# Patient Record
Sex: Female | Born: 1965 | Race: White | Hispanic: No | Smoking: Never smoker
Health system: Southern US, Community
[De-identification: ages and names within clinical notes are randomized; demographics above are authoritative.]

## PROBLEM LIST (undated history)

## (undated) DIAGNOSIS — Z9889 Other specified postprocedural states: Secondary | ICD-10-CM

## (undated) DIAGNOSIS — I10 Essential (primary) hypertension: Secondary | ICD-10-CM

## (undated) DIAGNOSIS — I428 Other cardiomyopathies: Secondary | ICD-10-CM

## (undated) HISTORY — DX: Other cardiomyopathies: I42.8

## (undated) HISTORY — PX: GASTRIC BYPASS: SHX52

## (undated) HISTORY — DX: Other specified postprocedural states: Z98.890

## (undated) HISTORY — PX: OTHER SURGICAL HISTORY: SHX169

## (undated) HISTORY — PX: BREAST SURGERY: SHX581

## (undated) HISTORY — DX: Essential (primary) hypertension: I10

---

## 2013-09-06 ENCOUNTER — Emergency Department (HOSPITAL_COMMUNITY): Payer: Self-pay

## 2013-09-06 ENCOUNTER — Encounter (HOSPITAL_COMMUNITY): Payer: Self-pay | Admitting: Emergency Medicine

## 2013-09-06 ENCOUNTER — Emergency Department (HOSPITAL_COMMUNITY)
Admission: EM | Admit: 2013-09-06 | Discharge: 2013-09-06 | Disposition: A | Discharge status: Home / Self Care | Payer: Self-pay | Attending: Emergency Medicine | Admitting: Emergency Medicine

## 2013-09-06 DIAGNOSIS — I509 Heart failure, unspecified: Secondary | ICD-10-CM | POA: Insufficient documentation

## 2013-09-06 DIAGNOSIS — J069 Acute upper respiratory infection, unspecified: Secondary | ICD-10-CM | POA: Insufficient documentation

## 2013-09-06 DIAGNOSIS — I158 Other secondary hypertension: Secondary | ICD-10-CM | POA: Insufficient documentation

## 2013-09-06 DIAGNOSIS — I159 Secondary hypertension, unspecified: Secondary | ICD-10-CM

## 2013-09-06 DIAGNOSIS — R0602 Shortness of breath: Secondary | ICD-10-CM | POA: Insufficient documentation

## 2013-09-06 DIAGNOSIS — Z79899 Other long term (current) drug therapy: Secondary | ICD-10-CM | POA: Insufficient documentation

## 2013-09-06 LAB — I-STAT TROPONIN, ED: TROPONIN I, POC: 0.02 ng/mL (ref 0.00–0.08)

## 2013-09-06 LAB — BASIC METABOLIC PANEL
ANION GAP: 16 — AB (ref 5–15)
BUN: 12 mg/dL (ref 6–23)
CHLORIDE: 101 meq/L (ref 96–112)
CO2: 21 meq/L (ref 19–32)
Calcium: 9.8 mg/dL (ref 8.4–10.5)
Creatinine, Ser: 0.7 mg/dL (ref 0.50–1.10)
GFR calc Af Amer: >90 mL/min (ref >90)
GFR calc non Af Amer: >90 mL/min (ref >90)
Glucose, Bld: 106 mg/dL — ABNORMAL HIGH (ref 70–99)
Potassium: 4.2 mEq/L (ref 3.7–5.3)
SODIUM: 138 meq/L (ref 137–147)

## 2013-09-06 LAB — CBC
HCT: 47.6 % — ABNORMAL HIGH (ref 36.0–46.0)
Hemoglobin: 15.9 g/dL — ABNORMAL HIGH (ref 12.0–15.0)
MCH: 31.7 pg (ref 26.0–34.0)
MCHC: 33.4 g/dL (ref 30.0–36.0)
MCV: 95 fL (ref 78.0–100.0)
Platelets: 228 K/uL (ref 150–400)
RBC: 5.01 MIL/uL (ref 3.87–5.11)
RDW: 13.9 % (ref 11.5–15.5)
WBC: 7.3 K/uL (ref 4.0–10.5)

## 2013-09-06 LAB — PRO B NATRIURETIC PEPTIDE: PRO B NATRI PEPTIDE: 1343 pg/mL — AB (ref 0–125)

## 2013-09-06 MED ORDER — FUROSEMIDE 20 MG PO TABS: 20.0000 mg | ORAL_TABLET | Freq: Every day | ORAL | Status: DC

## 2013-09-06 MED ORDER — LISINOPRIL 5 MG PO TABS: 5.0000 mg | ORAL_TABLET | Freq: Every day | ORAL | Status: DC

## 2013-09-06 MED ORDER — FUROSEMIDE 10 MG/ML IJ SOLN: 40.0000 mg | Freq: Once | INTRAMUSCULAR | Status: DC

## 2013-09-06 MED ORDER — ASPIRIN 325 MG PO TABS
325.0000 mg | ORAL_TABLET | Freq: Once | ORAL | Status: AC
  Administered 2013-09-06: 325 mg via ORAL
  Filled 2013-09-06: qty 1

## 2013-09-06 MED ORDER — ALBUTEROL SULFATE HFA 108 (90 BASE) MCG/ACT IN AERS
4.0000 | INHALATION_SPRAY | Freq: Once | RESPIRATORY_TRACT | Status: AC
  Administered 2013-09-06: 4 via RESPIRATORY_TRACT
  Filled 2013-09-06: qty 6.7

## 2013-09-06 MED ORDER — FUROSEMIDE 40 MG PO TABS
40.0000 mg | ORAL_TABLET | Freq: Once | ORAL | Status: AC
Start: 2013-09-06 — End: 2013-09-06
  Administered 2013-09-06: 40 mg via ORAL
  Filled 2013-09-06: qty 1

## 2013-09-06 MED ORDER — HYDROCHLOROTHIAZIDE 25 MG PO TABS: 12.5000 mg | ORAL_TABLET | Freq: Every day | ORAL | Status: DC

## 2013-09-06 NOTE — Discharge Instructions (Signed)
Heart Failure °Heart failure means your heart has trouble pumping blood. This makes it hard for your body to work well. Heart failure is usually a long-term (chronic) condition. You must take good care of yourself and follow your doctor's treatment plan. °HOME CARE °· Take your heart medicine as told by your doctor. °¨ Do not stop taking medicine unless your doctor tells you to. °¨ Do not skip any dose of medicine. °¨ Refill your medicines before they run out. °¨ Take other medicines only as told by your doctor or pharmacist. °· Stay active if told by your doctor. The elderly and people with severe heart failure should talk with a doctor about physical activity. °· Eat heart-healthy foods. Choose foods that are without trans fat and are low in saturated fat, cholesterol, and salt (sodium). This includes fresh or frozen fruits and vegetables, fish, lean meats, fat-free or low-fat dairy foods, whole grains, and high-fiber foods. Lentils and dried peas and beans (legumes) are also good choices. °· Limit salt if told by your doctor. °· Cook in a healthy way. Roast, grill, broil, bake, poach, steam, or stir-fry foods. °· Limit fluids as told by your doctor. °· Weigh yourself every morning. Do this after you pee (urinate) and before you eat breakfast. Write down your weight to give to your doctor. °· Take your blood pressure and write it down if your doctor tells you to. °· Ask your doctor how to check your pulse. Check your pulse as told. °· Lose weight if told by your doctor. °· Stop smoking or chewing tobacco. Do not use gum or patches that help you quit without your doctor's approval. °· Schedule and go to doctor visits as told. °· Nonpregnant women should have no more than 1 drink a day. Men should have no more than 2 drinks a day. Talk to your doctor about drinking alcohol. °· Stop illegal drug use. °· Stay current with shots (immunizations). °· Manage your health conditions as told by your doctor. °· Learn to  manage your stress. °· Rest when you are tired. °· If it is really hot outside: °¨ Avoid intense activities. °¨ Use air conditioning or fans, or get in a cooler place. °¨ Avoid caffeine and alcohol. °¨ Wear loose-fitting, lightweight, and light-colored clothing. °· If it is really cold outside: °¨ Avoid intense activities. °¨ Layer your clothing. °¨ Wear mittens or gloves, a hat, and a scarf when going outside. °¨ Avoid alcohol. °· Learn about heart failure and get support as needed. °· Get help to maintain or improve your quality of life and your ability to care for yourself as needed. °GET HELP IF:  °· You gain 03 lb/1.4 kg or more in 1 day or 05 lb/2.3 kg in a week. °· You are more short of breath than usual. °· You cannot do your normal activities. °· You tire easily. °· You cough more than normal, especially with activity. °· You have any or more puffiness (swelling) in areas such as your hands, feet, ankles, or belly (abdomen). °· You cannot sleep because it is hard to breathe. °· You feel like your heart is beating fast (palpitations). °· You get dizzy or light-headed when you stand up. °GET HELP RIGHT AWAY IF:  °· You have trouble breathing. °· There is a change in mental status, such as becoming less alert or not being able to focus. °· You have chest pain or discomfort. °· You faint. °MAKE SURE YOU:  °· Understand these instructions. °·   Will watch your condition.  Will get help right away if you are not doing well or get worse. Document Released: 09/28/2007 Document Revised: 05/05/2013 Document Reviewed: 02/05/2012 [facility] Patient Information 2015 [place], [place]. This information is not intended to replace advice given to you by your health care provider. Make sure you discuss any questions you have with your health care provider.  Hypertension Hypertension, commonly called high blood pressure, is when the force of blood pumping through your arteries is too strong. Your arteries are the blood  vessels that carry blood from your heart throughout your body. A blood pressure reading consists of a higher number over a lower number, such as 110/72. The higher number (systolic) is the pressure inside your arteries when your heart pumps. The lower number (diastolic) is the pressure inside your arteries when your heart relaxes. Ideally you want your blood pressure below 120/80. Hypertension forces your heart to work harder to pump blood. Your arteries may become narrow or stiff. Having hypertension puts you at risk for heart disease, stroke, and other problems.  RISK FACTORS Some risk factors for high blood pressure are controllable. Others are not.  Risk factors you cannot control include:   Race. You may be at higher risk if you are African American.  Age. Risk increases with age.  Gender. Men are at higher risk than women before age 70 years. After age 52, women are at higher risk than men. Risk factors you can control include:  Not getting enough exercise or physical activity.  Being overweight.  Getting too much fat, sugar, calories, or salt in your diet.  Drinking too much alcohol. SIGNS AND SYMPTOMS Hypertension does not usually cause signs or symptoms. Extremely high blood pressure (hypertensive crisis) may cause headache, anxiety, shortness of breath, and nosebleed. DIAGNOSIS  To check if you have hypertension, your health care provider will measure your blood pressure while you are seated, with your arm held at the level of your heart. It should be measured at least twice using the same arm. Certain conditions can cause a difference in blood pressure between your right and left arms. A blood pressure reading that is higher than normal on one occasion does not mean that you need treatment. If one blood pressure reading is high, ask your health care provider about having it checked again. TREATMENT  Treating high blood pressure includes making lifestyle changes and possibly taking  medicine. Living a healthy lifestyle can help lower high blood pressure. You may need to change some of your habits. Lifestyle changes may include:  Following the DASH diet. This diet is high in fruits, vegetables, and whole grains. It is low in salt, red meat, and added sugars.  Getting at least 2 hours of brisk physical activity every week.  Losing weight if necessary.  Not smoking.  Limiting alcoholic beverages.  Learning ways to reduce stress. If lifestyle changes are not enough to get your blood pressure under control, your health care provider may prescribe medicine. You may need to take more than one. Work closely with your health care provider to understand the risks and benefits. HOME CARE INSTRUCTIONS  Have your blood pressure rechecked as directed by your health care provider.   Take medicines only as directed by your health care provider. Follow the directions carefully. Blood pressure medicines must be taken as prescribed. The medicine does not work as well when you skip doses. Skipping doses also puts you at risk for problems.   Do not smoke.  Monitor your blood pressure at home as directed by your health care provider. SEEK MEDICAL CARE IF:   You think you are having a reaction to medicines taken.  You have recurrent headaches or feel dizzy.  You have swelling in your ankles.  You have trouble with your vision. SEEK IMMEDIATE MEDICAL CARE IF:  You develop a severe headache or confusion.  You have unusual weakness, numbness, or feel faint.  You have severe chest or abdominal pain.  You vomit repeatedly.  You have trouble breathing. MAKE SURE YOU:   Understand these instructions.  Will watch your condition.  Will get help right away if you are not doing well or get worse. Document Released: 12/19/2004 Document Revised: 05/05/2013 Document Reviewed: 10/11/2012 [facility] Patient Information 2015 [place], [place]. This information is not intended to  replace advice given to you by your health care provider. Make sure you discuss any questions you have with your health care provider.  Upper Respiratory Infection, Adult An upper respiratory infection (URI) is also known as the common cold. It is often caused by a type of germ (virus). Colds are easily spread (contagious). You can pass it to others by kissing, coughing, sneezing, or drinking out of the same glass. Usually, you get better in 1 or 2 weeks.  HOME CARE   Only take medicine as told by your doctor.  Use a warm mist humidifier or breathe in steam from a hot shower.  Drink enough water and fluids to keep your pee (urine) clear or pale yellow.  Get plenty of rest.  Return to work when your temperature is back to normal or as told by your doctor. You may use a face mask and wash your hands to stop your cold from spreading. GET HELP RIGHT AWAY IF:   After the first few days, you feel you are getting worse.  You have questions about your medicine.  You have chills, shortness of breath, or brown or red spit (mucus).  You have yellow or brown snot (nasal discharge) or pain in the face, especially when you bend forward.  You have a fever, puffy (swollen) neck, pain when you swallow, or white spots in the back of your throat.  You have a bad headache, ear pain, sinus pain, or chest pain.  You have a high-pitched whistling sound when you breathe in and out (wheezing).  You have a lasting cough or cough up blood.  You have sore muscles or a stiff neck. MAKE SURE YOU:   Understand these instructions.  Will watch your condition.  Will get help right away if you are not doing well or get worse. Document Released: 06/07/2007 Document Revised: 03/13/2011 Document Reviewed: 03/26/2013 [facility] Patient Information 2015 [place], [place]. This information is not intended to replace advice given to you by your health care provider. Make sure you discuss any questions you have with your  health care provider.

## 2013-09-06 NOTE — ED Notes (Signed)
Per pt, states difficulty breathing since Wed-no history of asthma

## 2013-09-06 NOTE — ED Provider Notes (Signed)
CSN: [id number]     Arrival date & time 09/06/13  4098 History   First MD Initiated Contact with Patient 09/06/13 0914     Chief Complaint  Patient presents with  . Shortness of Breath     (Consider location/radiation/quality/duration/timing/severity/associated sxs/prior Treatment) Patient is a 48 y.o. female presenting with shortness of breath. The history is provided by the patient. No language interpreter was used.  Shortness of Breath Severity:  Moderate Onset quality:  Gradual Duration:  3 days Timing:  Constant Progression:  Worsening Chronicity:  New Relieved by:  Nothing Worsened by:  Movement (laying flat) Ineffective treatments:  Rest Associated symptoms: chest pain, fever and wheezing   Associated symptoms: no abdominal pain, no cough, no diaphoresis, no headaches, no neck pain, no sore throat, no sputum production and no vomiting   Associated symptoms comment:  Nausea  Chest pain:    Chest pain quality: tightness.   Severity:  Moderate   Duration:  3 days   Timing:  Constant   Progression:  Worsening   Chronicity:  New Fever:    Duration:  3 days   Timing:  Intermittent   Temp source:  Subjective   Progression:  Waxing and waning Risk factors: obesity   Risk factors: no hx of PE/DVT and no tobacco use     History reviewed. No pertinent past medical history. Past Surgical History  Procedure Laterality Date  . Gastric bypass    . Breast surgery    . Tummy tuck     No family history on file. History  Substance Use Topics  . Smoking status: Never Smoker   . Smokeless tobacco: Not on file  . Alcohol Use: Yes   OB History   Grav Para Term Preterm Abortions TAB SAB Ect Mult Living                 Review of Systems  Constitutional: Positive for fever and chills. Negative for diaphoresis, activity change, appetite change and fatigue.  HENT: Negative for congestion, facial swelling, rhinorrhea and sore throat.   Eyes: Negative for photophobia and  discharge.  Respiratory: Positive for chest tightness, shortness of breath and wheezing. Negative for cough and sputum production.   Cardiovascular: Positive for chest pain. Negative for palpitations and leg swelling.  Gastrointestinal: Positive for nausea. Negative for vomiting, abdominal pain and diarrhea.  Endocrine: Negative for polydipsia and polyuria.  Genitourinary: Negative for dysuria, frequency, difficulty urinating and pelvic pain.  Musculoskeletal: Negative for arthralgias, back pain, neck pain and neck stiffness.  Skin: Negative for color change and wound.  Allergic/Immunologic: Negative for immunocompromised state.  Neurological: Negative for facial asymmetry, weakness, numbness and headaches.  Hematological: Does not bruise/bleed easily.  Psychiatric/Behavioral: Negative for confusion and agitation.      Allergies  Review of patient's allergies indicates no known allergies.  Home Medications   Prior to Admission medications   Medication Sig Start Date End Date Taking? Authorizing Provider  ibuprofen (ADVIL,MOTRIN) 200 MG tablet Take 400 mg by mouth every 6 (six) hours as needed for mild pain or moderate pain.   Yes Historical Provider, MD  furosemide (LASIX) 20 MG tablet Take 1 tablet (20 mg total) by mouth daily. 09/06/13   Toy Cookey, MD  lisinopril (PRINIVIL,ZESTRIL) 5 MG tablet Take 1 tablet (5 mg total) by mouth daily. 09/06/13   Toy Cookey, MD   BP 174/106  Pulse 86  Temp(Src) 97.9 F (36.6 C) (Oral)  Resp 18  SpO2 99% Physical Exam  Constitutional: She is oriented to person, place, and time. She appears well-developed and well-nourished. No distress.  HENT:  Head: Normocephalic and atraumatic.  Mouth/Throat: No oropharyngeal exudate.  Eyes: Pupils are equal, round, and reactive to light.  Neck: Normal range of motion. Neck supple.  Cardiovascular: Normal rate, regular rhythm and normal heart sounds.  Exam reveals no gallop and no friction rub.   No  murmur heard. Pulmonary/Chest: Effort normal. No respiratory distress. She has wheezes in the left lower field. She has rales in the right lower field and the left lower field.    Abdominal: Soft. Bowel sounds are normal. She exhibits no distension and no mass. There is no tenderness. There is no rebound and no guarding.  Musculoskeletal: Normal range of motion. She exhibits no edema and no tenderness.  Neurological: She is alert and oriented to person, place, and time.  Skin: Skin is warm and dry.  Psychiatric: She has a normal mood and affect.    ED Course  Procedures (including critical care time) Labs Review Labs Reviewed  CBC - Abnormal; Notable for the following:    Hemoglobin 15.9 (*)    HCT 47.6 (*)    All other components within normal limits  BASIC METABOLIC PANEL - Abnormal; Notable for the following:    Glucose, Bld 106 (*)    Anion gap 16 (*)    All other components within normal limits  PRO B NATRIURETIC PEPTIDE - Abnormal; Notable for the following:    Pro B Natriuretic peptide (BNP) 1343.0 (*)    All other components within normal limits  Rosezena Sensor, ED    Imaging Review Dg Chest 2 View  09/06/2013   CLINICAL DATA:  Short of breath.  Chest pain  EXAM: CHEST  2 VIEW  COMPARISON:  None.  FINDINGS: Moderate cardiomegaly. Interstitial edema. Right hilum is prominent. No pleural effusion or pneumothorax.  IMPRESSION: CHF.  Prominent right hilum. Follow-up studies until resolution are recommended.   Electronically Signed   By: Maryclare Bean M.D.   On: 09/06/2013 09:51     EKG Interpretation   Date/Time:  Saturday September 06 2013 09:04:49 EDT Ventricular Rate:  94 PR Interval:  162 QRS Duration: 150 QT Interval:  407 QTC Calculation: 509 R Axis:   -29 Text Interpretation:  Sinus rhythm Left bundle branch block Baseline  wander in lead(s) III aVL No old tracing to compare Confirmed by CAMPOS   MD, Caryn Bee ([id number]) on 09/06/2013 9:14:12 AM Also confirmed by Patria Mane   MD,  Caryn Bee ([id number])  on 09/06/2013 9:41:54 AM      MDM   Final diagnoses:  Viral URI  Acute congestive heart failure, unspecified congestive heart failure type  Secondary hypertension, unspecified    Pt is a 48 y.o. female with Pmhx as above who presents with SOB, constant chest tightness for 3 days along w/ assoc subjective fever, chills, nausea, body aches, and wheezing. She is having trouble laying flat. She reports taking antihypertensives several years ago, but none recently. On PE, Pt is hypertensive, but 100% on RA, [id number]. She has mild rales BL and slight wheezing heard at lower L lung. No LE edema. EKG with LBBB, w/o comparison.   Trop neg, BNP mildly elevated at 1343, CXR with CHF and prominent R hilum. 1 dose of  PO lasix given, albuterol MDI repeated. I suspect pt has mild CHF likely due to uncontrolled HTN, but also viral URI given myalgias, subjective fever/chills, and nausea. Will have her f/u in  CHF clinic w/ Dr. Daleen Squibb on Wednesday, start on low dose lisinopril. Return precautions given for new or worsening symptoms including SOB, CP, leg swelling       Toy Cookey, MD 09/06/13 1229

## 2013-09-06 NOTE — ED Notes (Signed)
Patient transported to X-ray 

## 2013-11-17 ENCOUNTER — Encounter (HOSPITAL_COMMUNITY): Payer: Self-pay | Admitting: Emergency Medicine

## 2013-11-17 ENCOUNTER — Emergency Department (HOSPITAL_COMMUNITY)
Admission: EM | Admit: 2013-11-17 | Discharge: 2013-11-17 | Disposition: A | Discharge status: Home / Self Care | Payer: Self-pay | Attending: Emergency Medicine | Admitting: Emergency Medicine

## 2013-11-17 DIAGNOSIS — Z3202 Encounter for pregnancy test, result negative: Secondary | ICD-10-CM | POA: Insufficient documentation

## 2013-11-17 DIAGNOSIS — N72 Inflammatory disease of cervix uteri: Secondary | ICD-10-CM | POA: Insufficient documentation

## 2013-11-17 DIAGNOSIS — I10 Essential (primary) hypertension: Secondary | ICD-10-CM | POA: Insufficient documentation

## 2013-11-17 DIAGNOSIS — Z79899 Other long term (current) drug therapy: Secondary | ICD-10-CM | POA: Insufficient documentation

## 2013-11-17 LAB — CBC WITH DIFFERENTIAL/PLATELET
BASOS ABS: 0.1 10*3/uL (ref 0.0–0.1)
Basophils Relative: 1 % (ref 0–1)
Eosinophils Absolute: 0.1 10*3/uL (ref 0.0–0.7)
Eosinophils Relative: 1 % (ref 0–5)
HCT: 47 % — ABNORMAL HIGH (ref 36.0–46.0)
Hemoglobin: 16 g/dL — ABNORMAL HIGH (ref 12.0–15.0)
LYMPHS ABS: 1.9 10*3/uL (ref 0.7–4.0)
LYMPHS PCT: 38 % (ref 12–46)
MCH: 32.7 pg (ref 26.0–34.0)
MCHC: 34 g/dL (ref 30.0–36.0)
MCV: 95.9 fL (ref 78.0–100.0)
Monocytes Absolute: 0.3 10*3/uL (ref 0.1–1.0)
Monocytes Relative: 6 % (ref 3–12)
NEUTROS ABS: 2.6 10*3/uL (ref 1.7–7.7)
NEUTROS PCT: 54 % (ref 43–77)
PLATELETS: 203 10*3/uL (ref 150–400)
RBC: 4.9 MIL/uL (ref 3.87–5.11)
RDW: 13.9 % (ref 11.5–15.5)
WBC: 4.9 10*3/uL (ref 4.0–10.5)

## 2013-11-17 LAB — COMPREHENSIVE METABOLIC PANEL
ALK PHOS: 84 U/L (ref 39–117)
ALT: 32 U/L (ref 0–35)
AST: 25 U/L (ref 0–37)
Albumin: 4 g/dL (ref 3.5–5.2)
Anion gap: 17 — ABNORMAL HIGH (ref 5–15)
BUN: 10 mg/dL (ref 6–23)
CALCIUM: 10.6 mg/dL — AB (ref 8.4–10.5)
CO2: 24 meq/L (ref 19–32)
Chloride: 101 mEq/L (ref 96–112)
Creatinine, Ser: 0.66 mg/dL (ref 0.50–1.10)
GLUCOSE: 95 mg/dL (ref 70–99)
POTASSIUM: 3.8 meq/L (ref 3.7–5.3)
SODIUM: 142 meq/L (ref 137–147)
Total Bilirubin: 0.3 mg/dL (ref 0.3–1.2)
Total Protein: 7.9 g/dL (ref 6.0–8.3)

## 2013-11-17 LAB — URINE MICROSCOPIC-ADD ON

## 2013-11-17 LAB — PREGNANCY, URINE: Preg Test, Ur: NEGATIVE

## 2013-11-17 LAB — URINALYSIS, ROUTINE W REFLEX MICROSCOPIC
Bilirubin Urine: NEGATIVE
GLUCOSE, UA: NEGATIVE mg/dL
Hgb urine dipstick: NEGATIVE
Ketones, ur: NEGATIVE mg/dL
Nitrite: NEGATIVE
PROTEIN: NEGATIVE mg/dL
SPECIFIC GRAVITY, URINE: 1.02 (ref 1.005–1.030)
UROBILINOGEN UA: 1 mg/dL (ref 0.0–1.0)
pH: 5.5 (ref 5.0–8.0)

## 2013-11-17 LAB — WET PREP, GENITAL
TRICH WET PREP: NONE SEEN
YEAST WET PREP: NONE SEEN

## 2013-11-17 LAB — POC URINE PREG, ED: Preg Test, Ur: NEGATIVE

## 2013-11-17 LAB — RPR

## 2013-11-17 MED ORDER — MEDROXYPROGESTERONE ACETATE 2.5 MG PO TABS: 2.5000 mg | ORAL_TABLET | Freq: Every day | ORAL | Status: DC

## 2013-11-17 MED ORDER — METRONIDAZOLE 500 MG PO TABS: 500.0000 mg | ORAL_TABLET | Freq: Two times a day (BID) | ORAL | Status: DC

## 2013-11-17 MED ORDER — AZITHROMYCIN 1 G PO PACK
1.0000 g | PACK | Freq: Once | ORAL | Status: AC
  Administered 2013-11-17: 1 g via ORAL
  Filled 2013-11-17: qty 1

## 2013-11-17 MED ORDER — CEFTRIAXONE SODIUM 250 MG IJ SOLR
250.0000 mg | Freq: Once | INTRAMUSCULAR | Status: AC
  Administered 2013-11-17: 250 mg via INTRAMUSCULAR
  Filled 2013-11-17: qty 250

## 2013-11-17 MED ORDER — LIDOCAINE HCL 1 % IJ SOLN
INTRAMUSCULAR | Status: AC
Start: 2013-11-17 — End: 2013-11-17
  Administered 2013-11-17: 0.9 mL
  Filled 2013-11-17: qty 20

## 2013-11-17 MED ORDER — LISINOPRIL 20 MG PO TABS: 20.0000 mg | ORAL_TABLET | Freq: Every day | ORAL | Status: DC

## 2013-11-17 MED ORDER — DOXYCYCLINE HYCLATE 100 MG PO CAPS: 100.0000 mg | ORAL_CAPSULE | Freq: Two times a day (BID) | ORAL | Status: DC

## 2013-11-17 NOTE — ED Provider Notes (Addendum)
CSN: [id number]     Arrival date & time 11/17/13  8882 History   First MD Initiated Contact with Patient 11/17/13 [phone]     Chief Complaint  Patient presents with  . Hypertension  . Anxiety     (Consider location/radiation/quality/duration/timing/severity/associated sxs/prior Treatment) Patient is a 48 y.o. female presenting with hypertension. The history is provided by the patient (pt complains of htn and vaginal discharge).  Hypertension This is a recurrent problem. The current episode started 3 to 5 hours ago. The problem occurs constantly. The problem has not changed since onset.Associated symptoms include abdominal pain. Pertinent negatives include no chest pain and no headaches. Nothing aggravates the symptoms. Nothing relieves the symptoms. The treatment provided no relief.    History reviewed. No pertinent past medical history. Past Surgical History  Procedure Laterality Date  . Gastric bypass    . Breast surgery    . Tummy tuck     No family history on file. History  Substance Use Topics  . Smoking status: Never Smoker   . Smokeless tobacco: Not on file  . Alcohol Use: Yes   OB History    No data available     Review of Systems  Constitutional: Negative for appetite change and fatigue.  HENT: Negative for congestion, ear discharge and sinus pressure.   Eyes: Negative for discharge.  Respiratory: Negative for cough.   Cardiovascular: Negative for chest pain.  Gastrointestinal: Positive for abdominal pain. Negative for diarrhea.  Genitourinary: Negative for frequency and hematuria.       Vaginal discharge  Musculoskeletal: Negative for back pain.  Skin: Negative for rash.  Neurological: Negative for seizures and headaches.  Psychiatric/Behavioral: Negative for hallucinations.      Allergies  Review of patient's allergies indicates no known allergies.  Home Medications   Prior to Admission medications   Medication Sig Start Date End Date Taking?  Authorizing Provider  furosemide (LASIX) 20 MG tablet Take 1 tablet (20 mg total) by mouth daily. 09/06/13  Yes Toy Cookey, MD  ibuprofen (ADVIL,MOTRIN) 200 MG tablet Take 400 mg by mouth every 6 (six) hours as needed for headache, mild pain, moderate pain or cramping.    Yes Historical Provider, MD  doxycycline (VIBRAMYCIN) 100 MG capsule Take 1 capsule (100 mg total) by mouth 2 (two) times daily. One po bid x 7 days 11/17/13   Benny Lennert, MD  lisinopril (PRINIVIL,ZESTRIL) 20 MG tablet Take 1 tablet (20 mg total) by mouth daily. 11/17/13   Benny Lennert, MD  medroxyPROGESTERone (PROVERA) 2.5 MG tablet Take 1 tablet (2.5 mg total) by mouth daily. 11/17/13   Benny Lennert, MD  metroNIDAZOLE (FLAGYL) 500 MG tablet Take 1 tablet (500 mg total) by mouth 2 (two) times daily. One po bid x 7 days 11/17/13   Benny Lennert, MD   BP 178/114 mmHg  Pulse 86  Temp(Src) 98.3 F (36.8 C) (Oral)  Resp 18  SpO2 95% Physical Exam  Constitutional: She is oriented to person, place, and time. She appears well-developed.  HENT:  Head: Normocephalic.  Eyes: Conjunctivae and EOM are normal. No scleral icterus.  Neck: Neck supple. No thyromegaly present.  Cardiovascular: Normal rate and regular rhythm.  Exam reveals no gallop and no friction rub.   No murmur heard. Pulmonary/Chest: No stridor. She has no wheezes. She has no rales. She exhibits no tenderness.  Abdominal: She exhibits no distension. There is no tenderness. There is no rebound.  Genitourinary:  Minor tender cervix with  yellow discharge.   Non tender cervix  Musculoskeletal: Normal range of motion. She exhibits no edema.  Lymphadenopathy:    She has no cervical adenopathy.  Neurological: She is oriented to person, place, and time. She exhibits normal muscle tone. Coordination normal.  Skin: No rash noted. No erythema.  Psychiatric: She has a normal mood and affect. Her behavior is normal.    ED Course  Procedures (including  critical care time) Labs Review Labs Reviewed  WET PREP, GENITAL - Abnormal; Notable for the following:    Clue Cells Wet Prep HPF POC FEW (*)    WBC, Wet Prep HPF POC TOO NUMEROUS TO COUNT (*)    All other components within normal limits  CBC WITH DIFFERENTIAL - Abnormal; Notable for the following:    Hemoglobin 16.0 (*)    HCT 47.0 (*)    All other components within normal limits  COMPREHENSIVE METABOLIC PANEL - Abnormal; Notable for the following:    Calcium 10.6 (*)    Anion gap 17 (*)    All other components within normal limits  URINALYSIS, ROUTINE W REFLEX MICROSCOPIC - Abnormal; Notable for the following:    Leukocytes, UA SMALL (*)    All other components within normal limits  URINE MICROSCOPIC-ADD ON - Abnormal; Notable for the following:    Bacteria, UA MANY (*)    All other components within normal limits  GC/CHLAMYDIA PROBE AMP  PREGNANCY, URINE  RPR  POC URINE PREG, ED    Imaging Review No results found.   EKG Interpretation None      MDM   Final diagnoses:  Cervicitis  Essential hypertension    tx with lisinopril and  Antibiotics.  Pt  To follow up with gyn clinic    Benny LennertJoseph L Erdem Naas, MD 11/17/13 1244  Benny LennertJoseph L Shawanna Zanders, MD 11/17/13 [phone]

## 2013-11-17 NOTE — ED Notes (Signed)
Pt alert, nad, resp even unlabored, skin pwd, denies needs, ambulates to discharge 

## 2013-11-17 NOTE — Discharge Instructions (Signed)
[facility]

## 2013-11-17 NOTE — ED Notes (Addendum)
Pt states she has high BP, has not had BP meds x about 1 month. Pt also c/o anxiety. States she has not had a period x 3 years and started bleeding last saturday and she states since then " I cannot get my hormones together, my hormones are raging." Pt states she also has a headache and got dizzy at work last night. Pt also states 'I want to get checked down there because I don't know if my boyfriend gave me something, i don't feel the same down there."

## 2013-11-18 LAB — GC/CHLAMYDIA PROBE AMP
CT PROBE, AMP APTIMA: NEGATIVE
GC Probe RNA: NEGATIVE

## 2014-04-12 ENCOUNTER — Emergency Department (HOSPITAL_COMMUNITY): Payer: Self-pay

## 2014-04-12 ENCOUNTER — Encounter (HOSPITAL_COMMUNITY): Payer: Self-pay | Admitting: *Deleted

## 2014-04-12 ENCOUNTER — Emergency Department (HOSPITAL_COMMUNITY)
Admission: EM | Admit: 2014-04-12 | Discharge: 2014-04-12 | Disposition: A | Discharge status: Home / Self Care | Payer: Self-pay | Attending: Emergency Medicine | Admitting: Emergency Medicine

## 2014-04-12 DIAGNOSIS — M25472 Effusion, left ankle: Secondary | ICD-10-CM | POA: Insufficient documentation

## 2014-04-12 DIAGNOSIS — R05 Cough: Secondary | ICD-10-CM | POA: Insufficient documentation

## 2014-04-12 DIAGNOSIS — M25471 Effusion, right ankle: Secondary | ICD-10-CM | POA: Insufficient documentation

## 2014-04-12 DIAGNOSIS — I5023 Acute on chronic systolic (congestive) heart failure: Secondary | ICD-10-CM | POA: Insufficient documentation

## 2014-04-12 DIAGNOSIS — I10 Essential (primary) hypertension: Secondary | ICD-10-CM | POA: Insufficient documentation

## 2014-04-12 DIAGNOSIS — Z79899 Other long term (current) drug therapy: Secondary | ICD-10-CM | POA: Insufficient documentation

## 2014-04-12 DIAGNOSIS — I252 Old myocardial infarction: Secondary | ICD-10-CM | POA: Insufficient documentation

## 2014-04-12 DIAGNOSIS — R0981 Nasal congestion: Secondary | ICD-10-CM | POA: Insufficient documentation

## 2014-04-12 DIAGNOSIS — Z792 Long term (current) use of antibiotics: Secondary | ICD-10-CM | POA: Insufficient documentation

## 2014-04-12 LAB — BASIC METABOLIC PANEL
Anion gap: 13 (ref 5–15)
BUN: 11 mg/dL (ref 6–23)
CO2: 23 mmol/L (ref 19–32)
CREATININE: 0.73 mg/dL (ref 0.50–1.10)
Calcium: 9.5 mg/dL (ref 8.4–10.5)
Chloride: 104 mmol/L (ref 96–112)
GFR calc Af Amer: >90 mL/min (ref >90)
GFR calc non Af Amer: >90 mL/min (ref >90)
Glucose, Bld: 106 mg/dL — ABNORMAL HIGH (ref 70–99)
Potassium: 3.3 mmol/L — ABNORMAL LOW (ref 3.5–5.1)
SODIUM: 140 mmol/L (ref 135–145)

## 2014-04-12 LAB — CBC
HCT: 49.6 % — ABNORMAL HIGH (ref 36.0–46.0)
Hemoglobin: 16.8 g/dL — ABNORMAL HIGH (ref 12.0–15.0)
MCH: 33.6 pg (ref 26.0–34.0)
MCHC: 33.9 g/dL (ref 30.0–36.0)
MCV: 99.2 fL (ref 78.0–100.0)
Platelets: 224 10*3/uL (ref 150–400)
RBC: 5 MIL/uL (ref 3.87–5.11)
RDW: 13.6 % (ref 11.5–15.5)
WBC: 7.4 10*3/uL (ref 4.0–10.5)

## 2014-04-12 LAB — I-STAT TROPONIN, ED: TROPONIN I, POC: 0.03 ng/mL (ref 0.00–0.08)

## 2014-04-12 LAB — BRAIN NATRIURETIC PEPTIDE: B Natriuretic Peptide: 786.7 pg/mL — ABNORMAL HIGH (ref 0.0–100.0)

## 2014-04-12 MED ORDER — NITROGLYCERIN 0.4 MG SL SUBL
0.4000 mg | SUBLINGUAL_TABLET | Freq: Once | SUBLINGUAL | Status: AC
  Administered 2014-04-12: 0.4 mg via SUBLINGUAL
  Filled 2014-04-12: qty 1

## 2014-04-12 MED ORDER — LISINOPRIL 20 MG PO TABS
20.0000 mg | ORAL_TABLET | Freq: Every day | ORAL | Status: DC
  Administered 2014-04-12: 20 mg via ORAL
  Filled 2014-04-12: qty 1

## 2014-04-12 MED ORDER — POTASSIUM CHLORIDE CRYS ER 20 MEQ PO TBCR: 20.0000 meq | EXTENDED_RELEASE_TABLET | Freq: Two times a day (BID) | ORAL | Status: DC

## 2014-04-12 MED ORDER — AMLODIPINE BESYLATE 5 MG PO TABS: 5.0000 mg | ORAL_TABLET | Freq: Every day | ORAL | Status: DC

## 2014-04-12 MED ORDER — FUROSEMIDE 10 MG/ML IJ SOLN
20.0000 mg | Freq: Once | INTRAMUSCULAR | Status: AC
  Administered 2014-04-12: 20 mg via INTRAVENOUS
  Filled 2014-04-12: qty 2

## 2014-04-12 MED ORDER — FUROSEMIDE 20 MG PO TABS: 20.0000 mg | ORAL_TABLET | Freq: Every day | ORAL | Status: DC

## 2014-04-12 MED ORDER — POTASSIUM CHLORIDE CRYS ER 20 MEQ PO TBCR
40.0000 meq | EXTENDED_RELEASE_TABLET | Freq: Once | ORAL | Status: AC
  Administered 2014-04-12: 40 meq via ORAL
  Filled 2014-04-12: qty 2

## 2014-04-12 MED ORDER — LISINOPRIL 20 MG PO TABS: 20.0000 mg | ORAL_TABLET | Freq: Every day | ORAL | Status: DC

## 2014-04-12 NOTE — Discharge Instructions (Signed)
It was our pleasure to provide your ER care today - we hope that you feel better.  Take blood pressure medications as prescribed. Limit salt intake.  Take lasix as prescribed.  From today's labs, your potassium is mildly low (3.3) - eat plenty of fruits and vegetables, take potassium supplement as prescribed, and follow up with primary care doctor for recheck in coming week.  Follow up with cardiologist/CHF clinic in the next couple of days - see referral - call office tomorrow morning to arrange appointment.  Return to ER right away if worse, new symptoms, fevers, recurrent or persistent chest pain, worsening breathing, other concern.    Heart Failure Heart failure is a condition in which the heart has trouble pumping blood. This means your heart does not pump blood efficiently for your body to work well. In some cases of heart failure, fluid may back up into your lungs or you may have swelling (edema) in your lower legs. Heart failure is usually a long-term (chronic) condition. It is important for you to take good care of yourself and follow your health care provider's treatment plan. CAUSES  Some health conditions can cause heart failure. Those health conditions include:  High blood pressure (hypertension). Hypertension causes the heart muscle to work harder than normal. When pressure in the blood vessels is high, the heart needs to pump (contract) with more force in order to circulate blood throughout the body. High blood pressure eventually causes the heart to become stiff and weak.  Coronary artery disease (CAD). CAD is the buildup of cholesterol and fat (plaque) in the arteries of the heart. The blockage in the arteries deprives the heart muscle of oxygen and blood. This can cause chest pain and may lead to a heart attack. High blood pressure can also contribute to CAD.  Heart attack (myocardial infarction). A heart attack occurs when one or more arteries in the heart become blocked.  The loss of oxygen damages the muscle tissue of the heart. When this happens, part of the heart muscle dies. The injured tissue does not contract as well and weakens the heart's ability to pump blood.  Abnormal heart valves. When the heart valves do not open and close properly, it can cause heart failure. This makes the heart muscle pump harder to keep the blood flowing.  Heart muscle disease (cardiomyopathy or myocarditis). Heart muscle disease is damage to the heart muscle from a variety of causes. These can include drug or alcohol abuse, infections, or unknown reasons. These can increase the risk of heart failure.  Lung disease. Lung disease makes the heart work harder because the lungs do not work properly. This can cause a strain on the heart, leading it to fail.  Diabetes. Diabetes increases the risk of heart failure. High blood sugar contributes to high fat (lipid) levels in the blood. Diabetes can also cause slow damage to tiny blood vessels that carry important nutrients to the heart muscle. When the heart does not get enough oxygen and food, it can cause the heart to become weak and stiff. This leads to a heart that does not contract efficiently.  Other conditions can contribute to heart failure. These include abnormal heart rhythms, thyroid problems, and low blood counts (anemia). Certain unhealthy behaviors can increase the risk of heart failure, including:  Being overweight.  Smoking or chewing tobacco.  Eating foods high in fat and cholesterol.  Abusing illicit drugs or alcohol.  Lacking physical activity. SYMPTOMS  Heart failure symptoms may vary and  can be hard to detect. Symptoms may include:  Shortness of breath with activity, such as climbing stairs.  Persistent cough.  Swelling of the feet, ankles, legs, or abdomen.  Unexplained weight gain.  Difficulty breathing when lying flat (orthopnea).  Waking from sleep because of the need to sit up and get more  air.  Rapid heartbeat.  Fatigue and loss of energy.  Feeling light-headed, dizzy, or close to fainting.  Loss of appetite.  Nausea.  Increased urination during the night (nocturia). DIAGNOSIS  A diagnosis of heart failure is based on your history, symptoms, physical examination, and diagnostic tests. Diagnostic tests for heart failure may include:  Echocardiography.  Electrocardiography.  Chest X-ray.  Blood tests.  Exercise stress test.  Cardiac angiography.  Radionuclide scans. TREATMENT  Treatment is aimed at managing the symptoms of heart failure. Medicines, behavioral changes, or surgical intervention may be necessary to treat heart failure.  Medicines to help treat heart failure may include:  Angiotensin-converting enzyme (ACE) inhibitors. This type of medicine blocks the effects of a blood protein called angiotensin-converting enzyme. ACE inhibitors relax (dilate) the blood vessels and help lower blood pressure.  Angiotensin receptor blockers (ARBs). This type of medicine blocks the actions of a blood protein called angiotensin. Angiotensin receptor blockers dilate the blood vessels and help lower blood pressure.  Water pills (diuretics). Diuretics cause the kidneys to remove salt and water from the blood. The extra fluid is removed through urination. This loss of extra fluid lowers the volume of blood the heart pumps.  Beta blockers. These prevent the heart from beating too fast and improve heart muscle strength.  Digitalis. This increases the force of the heartbeat.  Healthy behavior changes include:  Obtaining and maintaining a healthy weight.  Stopping smoking or chewing tobacco.  Eating heart-healthy foods.  Limiting or avoiding alcohol.  Stopping illicit drug use.  Physical activity as directed by your health care provider.  Surgical treatment for heart failure may include:  A procedure to open blocked arteries, repair damaged heart valves, or  remove damaged heart muscle tissue.  A pacemaker to improve heart muscle function and control certain abnormal heart rhythms.  An internal cardioverter defibrillator to treat certain serious abnormal heart rhythms.  A left ventricular assist device (LVAD) to assist the pumping ability of the heart. HOME CARE INSTRUCTIONS   Take medicines only as directed by your health care provider. Medicines are important in reducing the workload of your heart, slowing the progression of heart failure, and improving your symptoms.  Do not stop taking your medicine unless directed by your health care provider.  Do not skip any dose of medicine.  Refill your prescriptions before you run out of medicine. Your medicines are needed every day.  Engage in moderate physical activity if directed by your health care provider. Moderate physical activity can benefit some people. The elderly and people with severe heart failure should consult with a health care provider for physical activity recommendations.  Eat heart-healthy foods. Food choices should be free of trans fat and low in saturated fat, cholesterol, and salt (sodium). Healthy choices include fresh or frozen fruits and vegetables, fish, lean meats, legumes, fat-free or low-fat dairy products, and whole grain or high fiber foods. Talk to a dietitian to learn more about heart-healthy foods.  Limit sodium if directed by your health care provider. Sodium restriction may reduce symptoms of heart failure in some people. Talk to a dietitian to learn more about heart-healthy seasonings.  Use healthy cooking  methods. Healthy cooking methods include roasting, grilling, broiling, baking, poaching, steaming, or stir-frying. Talk to a dietitian to learn more about healthy cooking methods.  Limit fluids if directed by your health care provider. Fluid restriction may reduce symptoms of heart failure in some people.  Weigh yourself every day. Daily weights are important  in the early recognition of excess fluid. You should weigh yourself every morning after you urinate and before you eat breakfast. Wear the same amount of clothing each time you weigh yourself. Record your daily weight. Provide your health care provider with your weight record.  Monitor and record your blood pressure if directed by your health care provider.  Check your pulse if directed by your health care provider.  Lose weight if directed by your health care provider. Weight loss may reduce symptoms of heart failure in some people.  Stop smoking or chewing tobacco. Nicotine makes your heart work harder by causing your blood vessels to constrict. Do not use nicotine gum or patches before talking to your health care provider.  Keep all follow-up visits as directed by your health care provider. This is important.  Limit alcohol intake to no more than 1 drink per day for nonpregnant women and 2 drinks per day for men. One drink equals 12 ounces of beer, 5 ounces of wine, or 1 ounces of hard liquor. Drinking more than that is harmful to your heart. Tell your health care provider if you drink alcohol several times a week. Talk with your health care provider about whether alcohol is safe for you. If your heart has already been damaged by alcohol or you have severe heart failure, drinking alcohol should be stopped completely.  Stop illicit drug use.  Stay up-to-date with immunizations. It is especially important to prevent respiratory infections through current pneumococcal and influenza immunizations.  Manage other health conditions such as hypertension, diabetes, thyroid disease, or abnormal heart rhythms as directed by your health care provider.  Learn to manage stress.  Plan rest periods when fatigued.  Learn strategies to manage high temperatures. If the weather is extremely hot:  Avoid vigorous physical activity.  Use air conditioning or fans or seek a cooler location.  Avoid caffeine  and alcohol.  Wear loose-fitting, lightweight, and light-colored clothing.  Learn strategies to manage cold temperatures. If the weather is extremely cold:  Avoid vigorous physical activity.  Layer clothes.  Wear mittens or gloves, a hat, and a scarf when going outside.  Avoid alcohol.  Obtain ongoing education and support as needed.  Participate in or seek rehabilitation as needed to maintain or improve independence and quality of life. SEEK MEDICAL CARE IF:   Your weight increases by 03 lb/1.4 kg in 1 day or 05 lb/2.3 kg in a week.  You have increasing shortness of breath that is unusual for you.  You are unable to participate in your usual physical activities.  You tire easily.  You cough more than normal, especially with physical activity.  You have any or more swelling in areas such as your hands, feet, ankles, or abdomen.  You are unable to sleep because it is hard to breathe.  You feel like your heart is beating fast (palpitations).  You become dizzy or light-headed upon standing up. SEEK IMMEDIATE MEDICAL CARE IF:   You have difficulty breathing.  There is a change in mental status such as decreased alertness or difficulty with concentration.  You have a pain or discomfort in your chest.  You have an episode  of fainting (syncope). MAKE SURE YOU:   Understand these instructions.  Will watch your condition.  Will get help right away if you are not doing well or get worse. Document Released: 12/19/2004 Document Revised: 05/05/2013 Document Reviewed: 01/19/2012 [facility] Patient Information 2015 [place], [place]. This information is not intended to replace advice given to you by your health care provider. Make sure you discuss any questions you have with your health care provider.   Cardiac Diet This diet can help prevent heart disease and stroke. Many factors influence your heart health, including eating and exercise habits. Coronary risk rises a lot with  abnormal blood fat (lipid) levels. Cardiac meal planning includes limiting unhealthy fats, increasing healthy fats, and making other small dietary changes. General guidelines are as follows:  Adjust calorie intake to reach and maintain desirable body weight.  Limit total fat intake to less than 30% of total calories. Saturated fat should be less than 7% of calories.  Saturated fats are found in animal products and in some vegetable products. Saturated vegetable fats are found in coconut oil, cocoa butter, palm oil, and palm kernel oil. Read labels carefully to avoid these products as much as possible. Use butter in moderation. Choose tub margarines and oils that have 2 grams of fat or less. Good cooking oils are canola and olive oils.  Practice low-fat cooking techniques. Do not fry food. Instead, broil, bake, boil, steam, grill, roast on a rack, stir-fry, or microwave it. Other fat reducing suggestions include:  Remove the skin from poultry.  Remove all visible fat from meats.  Skim the fat off stews, soups, and gravies before serving them.  Steam vegetables in water or broth instead of sauting them in fat.  Avoid foods with trans fat (or hydrogenated oils), such as commercially fried foods and commercially baked goods. Commercial shortening and deep-frying fats will contain trans fat.  Increase intake of fruits, vegetables, whole grains, and legumes to replace foods high in fat.  Increase consumption of nuts, legumes, and seeds to at least 4 servings weekly. One serving of a legume equals  cup, and 1 serving of nuts or seeds equals  cup.  Choose whole grains more often. Have 3 servings per day (a serving is 1 ounce [oz]).  Eat 4 to 5 servings of vegetables per day. A serving of vegetables is 1 cup of raw leafy vegetables;  cup of raw or cooked cut-up vegetables;  cup of vegetable juice.  Eat 4 to 5 servings of fruit per day. A serving of fruit is 1 medium whole fruit;  cup of  dried fruit;  cup of fresh, frozen, or canned fruit;  cup of 100% fruit juice.  Increase your intake of dietary fiber to 20 to 30 grams per day. Insoluble fiber may help lower your risk of heart disease and may help curb your appetite.  Soluble fiber binds cholesterol to be removed from the blood. Foods high in soluble fiber are dried beans, citrus fruits, oats, apples, bananas, broccoli, Brussels sprouts, and eggplant.  Try to include foods fortified with plant sterols or stanols, such as yogurt, breads, juices, or margarines. Choose several fortified foods to achieve a daily intake of 2 to 3 grams of plant sterols or stanols.  Foods with omega-3 fats can help reduce your risk of heart disease. Aim to have a 3.5 oz portion of fatty fish twice per week, such as salmon, mackerel, albacore tuna, sardines, lake trout, or herring. If you wish to take a fish  oil supplement, choose one that contains 1 gram of both DHA and EPA.  Limit processed meats to 2 servings (3 oz portion) weekly.  Limit the sodium in your diet to 1500 milligrams (mg) per day. If you have high blood pressure, talk to a registered dietitian about a DASH (Dietary Approaches to Stop Hypertension) eating plan.  Limit sweets and beverages with added sugar, such as soda, to no more than 5 servings per week. One serving is:   1 tablespoon sugar.  1 tablespoon jelly or jam.   cup sorbet.  1 cup lemonade.   cup regular soda. CHOOSING FOODS Starches  Allowed: Breads: All kinds (wheat, rye, raisin, white, oatmeal, [place], [place], and English muffin bread). Low-fat rolls: English muffins, frankfurter and hamburger buns, bagels, pita bread, tortillas (not fried). Pancakes, waffles, biscuits, and muffins made with recommended oil.  Avoid: Products made with saturated or trans fats, oils, or whole milk products. Butter rolls, cheese breads, croissants. Commercial doughnuts, muffins, sweet rolls, biscuits, waffles, pancakes,  store-bought mixes. Crackers  Allowed: Low-fat crackers and snacks: Animal, graham, rye, saltine (with recommended oil, no lard), oyster, and matzo crackers. Bread sticks, melba toast, rusks, flatbread, pretzels, and light popcorn.  Avoid: High-fat crackers: cheese crackers, butter crackers, and those made with coconut, palm oil, or trans fat (hydrogenated oils). Buttered popcorn. Cereals  Allowed: Hot or cold whole-grain cereals.  Avoid: Cereals containing coconut, hydrogenated vegetable fat, or animal fat. Potatoes / Pasta / Rice  Allowed: All kinds of potatoes, rice, and pasta (such as macaroni, spaghetti, and noodles).  Avoid: Pasta or rice prepared with cream sauce or high-fat cheese. Chow mein noodles, [place] fries. Vegetables  Allowed: All vegetables and vegetable juices.  Avoid: Fried vegetables. Vegetables in cream, butter, or high-fat cheese sauces. Limit coconut. Fruit in cream or custard. Protein  Allowed: Limit your intake of meat, seafood, and poultry to no more than 6 oz (cooked weight) per day. All lean, well-trimmed beef, veal, pork, and lamb. All chicken and [place] without skin. All fish and shellfish. Wild game: wild duck, rabbit, pheasant, and venison. Egg whites or low-cholesterol egg substitutes may be used as desired. Meatless dishes: recipes with dried beans, peas, lentils, and tofu (soybean curd). Seeds and nuts: all seeds and most nuts.  Avoid: Prime grade and other heavily marbled and fatty meats, such as short ribs, spare ribs, rib eye roast or steak, frankfurters, sausage, bacon, and high-fat luncheon meats, mutton. Caviar. Commercially fried fish. Domestic duck, goose, venison sausage. Organ meats: liver, gizzard, heart, chitterlings, brains, kidney, sweetbreads. Dairy  Allowed: Low-fat cheeses: nonfat or low-fat cottage cheese (1% or 2% fat), cheeses made with part skim milk, such as mozzarella, farmers, string, or ricotta. (Cheeses should be labeled no more  than 2 to 6 grams fat per oz.). Skim (or 1%) milk: liquid, powdered, or evaporated. Buttermilk made with low-fat milk. Drinks made with skim or low-fat milk or cocoa. Chocolate milk or cocoa made with skim or low-fat (1%) milk. Nonfat or low-fat yogurt.  Avoid: Whole milk cheeses, including colby, cheddar, [place], [address], [place], [place], [place], [address], [place], and blue. Creamed cottage cheese, cream cheese. Whole milk and whole milk products, including buttermilk or yogurt made from whole milk, drinks made from whole milk. Condensed milk, evaporated whole milk, and 2% milk. Soups and Combination Foods  Allowed: Low-fat low-sodium soups: broth, dehydrated soups, homemade broth, soups with the fat removed, homemade cream soups made with skim or low-fat milk. Low-fat spaghetti, lasagna, chili, and Spanish  rice if low-fat ingredients and low-fat cooking techniques are used.  Avoid: Cream soups made with whole milk, cream, or high-fat cheese. All other soups. Desserts and Sweets  Allowed: Sherbet, fruit ices, gelatins, meringues, and angel food cake. Homemade desserts with recommended fats, oils, and milk products. Jam, jelly, honey, marmalade, sugars, and syrups. Pure sugar candy, such as gum drops, hard candy, jelly beans, marshmallows, mints, and small amounts of dark chocolate.  Avoid: Commercially prepared cakes, pies, cookies, frosting, pudding, or mixes for these products. Desserts containing whole milk products, chocolate, coconut, lard, palm oil, or palm kernel oil. Ice cream or ice cream drinks. Candy that contains chocolate, coconut, butter, hydrogenated fat, or unknown ingredients. Buttered syrups. Fats and Oils  Allowed: Vegetable oils: safflower, sunflower, corn, soybean, cottonseed, sesame, canola, olive, or peanut. Non-hydrogenated margarines. Salad dressing or mayonnaise: homemade or commercial, made with a recommended oil. Low or nonfat salad dressing or mayonnaise.  Limit  added fats and oils to 6 to 8 tsp per day (includes fats used in cooking, baking, salads, and spreads on bread). Remember to count the "hidden fats" in foods.  Avoid: Solid fats and shortenings: butter, lard, salt pork, bacon drippings. Gravy containing meat fat, shortening, or suet. Cocoa butter, coconut. Coconut oil, palm oil, palm kernel oil, or hydrogenated oils: these ingredients are often used in bakery products, nondairy creamers, whipped toppings, candy, and commercially fried foods. Read labels carefully. Salad dressings made of unknown oils, sour cream, or cheese, such as blue cheese and Roquefort. Cream, all kinds: half-and-half, light, heavy, or whipping. Sour cream or cream cheese (even if "light" or low-fat). Nondairy cream substitutes: coffee creamers and sour cream substitutes made with palm, palm kernel, hydrogenated oils, or coconut oil. Beverages  Allowed: Coffee (regular or decaffeinated), tea. Diet carbonated beverages, mineral water. Alcohol: Check with your caregiver. Moderation is recommended.  Avoid: Whole milk, regular sodas, and juice drinks with added sugar. Condiments  Allowed: All seasonings and condiments. Cocoa powder. "Cream" sauces made with recommended ingredients.  Avoid: Carob powder made with hydrogenated fats. SAMPLE MENU Breakfast   cup orange juice   cup oatmeal  1 slice toast  1 tsp margarine  1 cup skim milk Lunch  [place] sandwich with 2 oz [place], 2 slices bread  Lettuce and tomato slices  Fresh fruit  Carrot sticks  Coffee or tea Snack  Fresh fruit or low-fat crackers Dinner  3 oz lean ground beef  1 baked potato  1 tsp margarine   cup asparagus  Lettuce salad  1 tbs non-creamy dressing   cup peach slices  1 cup skim milk Document Released: 09/28/2007 Document Revised: 06/20/2011 Document Reviewed: 02/18/2013 [employer] Patient Information 2015 [place], [place]. This information is not intended to replace advice  given to you by your health care provider. Make sure you discuss any questions you have with your health care provider.

## 2014-04-12 NOTE — ED Notes (Signed)
Pt reports having cold symptoms for over the past week, now productive cough and sob. Airway intact, ekg done.

## 2014-04-12 NOTE — ED Provider Notes (Signed)
CSN: [id number]     Arrival date & time 04/12/14  1348 History   First MD Initiated Contact with Patient 04/12/14 1502     Chief Complaint  Patient presents with  . Shortness of Breath  . Cough     (Consider location/radiation/quality/duration/timing/severity/associated sxs/prior Treatment) Patient is a 49 y.o. female presenting with shortness of breath and cough. The history is provided by the patient.  Shortness of Breath Associated symptoms: cough   Associated symptoms: no abdominal pain, no chest pain, no fever, no headaches, no neck pain, no rash and no vomiting   Cough Associated symptoms: shortness of breath   Associated symptoms: no chest pain, no chills, no fever, no headaches and no rash   pt c/o non productive cough, mild scratchy throat, body aches, and sob for the past week. Symptoms gradual onset, persistent, slowly worse. No acute or abrupt change or worsening today. +orthopnea. No pnd. Mild, symmetric, bilateral ankle edema. Denies hx chf or cad. No hx asthma or rad. Non smoker. Remote hx gastric bypass surgery.  Pt notes approximately 50 lb wt dec in past year due to combination working, change in diet, exercise. Pt denies fever or chills. Pt w hx htn, states out of all meds, including bp med for several months. Pt sates she had been on lasix in past, although not on chronic basis.  Pt indicates tightness/discomfort in chest, constant for past couple days, not pleuritic. Pt denies any episodic or exertional cp or discomfort.       Past Medical History  Diagnosis Date  . MI (myocardial infarction)    Past Surgical History  Procedure Laterality Date  . Gastric bypass    . Breast surgery    . Tummy tuck     History reviewed. No pertinent family history. History  Substance Use Topics  . Smoking status: Never Smoker   . Smokeless tobacco: Not on file  . Alcohol Use: Yes   OB History    No data available     Review of Systems  Constitutional: Negative for  fever and chills.  HENT: Positive for congestion. Negative for trouble swallowing.   Eyes: Negative for redness.  Respiratory: Positive for cough and shortness of breath.   Cardiovascular: Negative for chest pain and palpitations.  Gastrointestinal: Negative for vomiting, abdominal pain and diarrhea.  Endocrine: Negative for polyuria.  Genitourinary: Negative for flank pain.  Musculoskeletal: Negative for back pain and neck pain.  Skin: Negative for rash.  Neurological: Negative for headaches.  Hematological: Does not bruise/bleed easily.  Psychiatric/Behavioral: Negative for confusion.      Allergies  Review of patient's allergies indicates no known allergies.  Home Medications   Prior to Admission medications   Medication Sig Start Date End Date Taking? Authorizing Provider  doxycycline (VIBRAMYCIN) 100 MG capsule Take 1 capsule (100 mg total) by mouth 2 (two) times daily. One po bid x 7 days 11/17/13   Bethann Berkshire, MD  furosemide (LASIX) 20 MG tablet Take 1 tablet (20 mg total) by mouth daily. 09/06/13   Toy Cookey, MD  ibuprofen (ADVIL,MOTRIN) 200 MG tablet Take 400 mg by mouth every 6 (six) hours as needed for headache, mild pain, moderate pain or cramping.     Historical Provider, MD  lisinopril (PRINIVIL,ZESTRIL) 20 MG tablet Take 1 tablet (20 mg total) by mouth daily. 11/17/13   Bethann Berkshire, MD  medroxyPROGESTERone (PROVERA) 2.5 MG tablet Take 1 tablet (2.5 mg total) by mouth daily. 11/17/13   Bethann Berkshire, MD  metroNIDAZOLE (FLAGYL) 500 MG tablet Take 1 tablet (500 mg total) by mouth 2 (two) times daily. One po bid x 7 days 11/17/13   Bethann Berkshire, MD   BP 159/112 mmHg  Pulse 95  Temp(Src) 98.1 F (36.7 C) (Oral)  Resp 19  Ht 5\' 1"  (1.549 m)  Wt 199 lb 6.4 oz (90.447 kg)  BMI 37.70 kg/m2  SpO2 96% Physical Exam  Constitutional: She is oriented to person, place, and time. She appears well-developed and well-nourished. No distress.  HENT:  Head: Atraumatic.   Nose: Nose normal.  Mouth/Throat: Oropharynx is clear and moist.  No sinus or temporal tenderness.  Eyes: Conjunctivae and EOM are normal. Pupils are equal, round, and reactive to light. No scleral icterus.  Neck: Neck supple. No JVD present. No tracheal deviation present. No thyromegaly present.  No stiffness or rigidity.   Cardiovascular: Normal rate, regular rhythm, normal heart sounds and intact distal pulses.  Exam reveals no gallop and no friction rub.   No murmur heard. Pulmonary/Chest: Effort normal and breath sounds normal. No respiratory distress.  Abdominal: Soft. Normal appearance and bowel sounds are normal. She exhibits no distension. There is no tenderness.  Genitourinary:  No cva tenderness.  Musculoskeletal: Normal range of motion. She exhibits no tenderness.  Mild symmetric bilateral ankle edema.   Neurological: She is alert and oriented to person, place, and time. No cranial nerve deficit.  Motor intact bilaterally. Steady gait.   Skin: Skin is warm and dry. No rash noted. She is not diaphoretic.  Psychiatric: She has a normal mood and affect.  Nursing note and vitals reviewed.   ED Course  Procedures (including critical care time) Labs Review      EKG Interpretation   Date/Time:  Sunday April 12 2014 13:56:52 EDT Ventricular Rate:  88 PR Interval:  154 QRS Duration: 150 QT Interval:  422 QTC Calculation: 510 R Axis:   81 Text Interpretation:  Normal sinus rhythm Left bundle branch block No  significant change since last tracing Confirmed by Denton Lank  MD, Caryn Bee  ([id number]) on 04/12/2014 3:03:12 PM      MDM   Cxr. Labs.  Reviewed nursing notes and prior charts for additional history.   Repeat bp high.   C/o chest tightness. Sl ntg x 1.   Will give dose of her normal bp med.  Lasix iv.  Pts symptoms seem possibly combination viral uri, superimposed upon uncontrolled htn/non compliance meds and resultant mild chf.   kcl po.  Recheck pt  breathing comfortably. No increased wob. Room air pulse ox 96%.  Will plan d/c home on htn rx, lasix daily for the next few days, and close card f/u.  Pt denies cp or discomfort, is afeb, breathing comfortably, and currently appears stable for d/c.      Cathren Laine, MD 04/12/14 1754

## 2014-05-20 NOTE — Progress Notes (Signed)
Patient ID: Jenna Beltran, female   DOB: [birth year], 49 y.o.   MRN: [id number]     Cardiology Office Note   Date:  05/21/2014   ID:  Jenna Beltran, DOB 1965-06-16, MRN [id number]  PCP:  No primary care provider on file.  Cardiologist:   Charlton Haws, MD   Chief Complaint  Patient presents with  . New Evaluation    post hosp f/u, chronic systolic CHF      History of Present Illness: Jenna Beltran is a 49 y.o. female who presents for evaluation of HTN, dyspnea Seen in ER 04/12/14 with atypical chest pain, dyspnea and elevated BP after not taking meds for a few months .    CXR 4/10 prominent right hilum CE and mild edema Labs Reviewed :  BNP 786  K 3.3  Normal Cr  Ran out of meds again last 9 days and BP up.  Some chest and generalized body aching.  Non smoker no cough / fever.   Use to work at [employer] and lots of job stress Now working at [employer] and better   Sedentary Some exertional dyspnea in addition to tightness in chest Usually coincides with not taking BP meds   Past Medical History  Diagnosis Date  . MI (myocardial infarction)     Past Surgical History  Procedure Laterality Date  . Gastric bypass    . Breast surgery    . Tummy tuck       Current Outpatient Prescriptions  Medication Sig Dispense Refill  . ibuprofen (ADVIL,MOTRIN) 200 MG tablet Take 400 mg by mouth every 6 (six) hours as needed for headache, mild pain, moderate pain or cramping.     Marland Kitchen amLODipine (NORVASC) 5 MG tablet Take 1 tablet (5 mg total) by mouth daily. (Patient not taking: Reported on 05/21/2014) 30 tablet 0  . furosemide (LASIX) 20 MG tablet Take 1 tablet (20 mg total) by mouth daily. (Patient not taking: Reported on 05/21/2014) 30 tablet 0  . lisinopril (PRINIVIL,ZESTRIL) 20 MG tablet Take 1 tablet (20 mg total) by mouth daily. (Patient not taking: Reported on 05/21/2014) 30 tablet 0  . medroxyPROGESTERone (PROVERA) 2.5 MG tablet Take 1 tablet (2.5 mg total) by mouth daily. (Patient not  taking: Reported on 05/21/2014) 20 tablet 0  . potassium chloride SA (K-DUR,KLOR-CON) 20 MEQ tablet Take 1 tablet (20 mEq total) by mouth 2 (two) times daily. (Patient not taking: Reported on 05/21/2014) 10 tablet 0  . [DISCONTINUED] hydrochlorothiazide (HYDRODIURIL) 25 MG tablet Take 0.5 tablets (12.5 mg total) by mouth daily. 30 tablet 0   No current facility-administered medications for this visit.    Allergies:   Review of patient's allergies indicates no known allergies.    Social History:  The patient  reports that she has never smoked. She does not have any smokeless tobacco history on file. She reports that she drinks alcohol. She reports that she does not use illicit drugs.   Family History:  The patient's family history is not on file.    ROS:  Please see the history of present illness.   Otherwise, review of systems are positive for none.   All other systems are reviewed and negative.    PHYSICAL EXAM: VS:  BP 156/90 mmHg  Pulse 83  Ht 5\' 1"  (1.549 m)  Wt 89.359 kg (197 lb)  BMI 37.24 kg/m2  SpO2 97% , BMI Body mass index is 37.24 kg/(m^2). Affect appropriate Healthy:  appears stated age HEENT: normal Neck supple with  no adenopathy JVP normal no bruits no thyromegaly Lungs clear with no wheezing and good diaphragmatic motion Heart:  S1/S2 no murmur, no rub, gallop or click PMI normal Abdomen: benighn, BS positve, no tenderness, no AAA no bruit.  No HSM or HJR Distal pulses intact with no bruits No edema Neuro non-focal Skin warm and dry No muscular weakness    EKG:   04/14/14  SR rate 88 LBBB    Recent Labs: 09/06/2013: Pro B Natriuretic peptide (BNP) 1343.0* 11/17/2013: ALT 32 04/12/2014: B Natriuretic Peptide 786.7*; BUN 11; Creatinine 0.73; Hemoglobin 16.8*; Platelets 224; Potassium 3.3*; Sodium 140    Lipid Panel No results found for: CHOL, TRIG, HDL, CHOLHDL, VLDL, LDLCALC, LDLDIRECT    Wt Readings from Last 3 Encounters:  05/21/14 89.359 kg (197 lb)   04/12/14 90.447 kg (199 lb 6.4 oz)      Other studies Reviewed: Additional studies/ records that were reviewed today include:  Epic notes, labs CXR and ECG.    ASSESSMENT AND PLAN:  1.  Chest Pain:  With LBBB  Clinical CHF  F/u lexiscan myovue 2. CHF:  Elevated BNP  Continue diuretic  Echo to assess RV/LV function 3. HTN:  Elevated today has not taken meds in 9 days Discussed compliance 4. LBBB:  Duration unknown may be related to HTN or DCM  Yearly ECG see w/u above  5. Abnormal CXR:  F/u CT with contrast  Non smoker    Current medicines are reviewed at length with the patient today.  The patient does not have concerns regarding medicines.  The following changes have been made:  no change  Labs/ tests ordered today include:  Lexiscan myovue , Echo and Chest CT  No orders of the defined types were placed in this encounter.     Disposition:   FU with me next available      Signed, Charlton Haws, MD  05/21/2014 2:50 PM    [facility] [address] [place], [place], [place]  [address] Phone: [phone]; Fax: [phone]

## 2014-05-21 ENCOUNTER — Ambulatory Visit (INDEPENDENT_AMBULATORY_CARE_PROVIDER_SITE_OTHER): Payer: Self-pay | Admitting: Cardiovascular Disease

## 2014-05-21 ENCOUNTER — Encounter: Payer: Self-pay | Admitting: Cardiovascular Disease

## 2014-05-21 ENCOUNTER — Ambulatory Visit (INDEPENDENT_AMBULATORY_CARE_PROVIDER_SITE_OTHER)
Admission: RE | Admit: 2014-05-21 | Discharge: 2014-05-21 | Disposition: A | Discharge status: Home / Self Care | Payer: Self-pay | Source: Ambulatory Visit | Attending: Cardiovascular Disease | Admitting: Cardiovascular Disease

## 2014-05-21 VITALS — BP 156/90 | HR 83 | Ht 61.0 in | Wt 197.0 lb

## 2014-05-21 DIAGNOSIS — I517 Cardiomegaly: Secondary | ICD-10-CM

## 2014-05-21 DIAGNOSIS — R0789 Other chest pain: Secondary | ICD-10-CM

## 2014-05-21 DIAGNOSIS — R9389 Abnormal findings on diagnostic imaging of other specified body structures: Secondary | ICD-10-CM

## 2014-05-21 DIAGNOSIS — I10 Essential (primary) hypertension: Secondary | ICD-10-CM

## 2014-05-21 DIAGNOSIS — R938 Abnormal findings on diagnostic imaging of other specified body structures: Secondary | ICD-10-CM

## 2014-05-21 MED ORDER — IOHEXOL 300 MG/ML  SOLN
80.0000 mL | Freq: Once | INTRAMUSCULAR | Status: AC | PRN
  Administered 2014-05-21: 80 mL via INTRAVENOUS

## 2014-05-21 NOTE — Patient Instructions (Addendum)
Medication Instructions:  NO CHANGES   Labwork: NONE  Testing/Procedures: CHEST  CT  WITH AND WITHOUT  CONTRAST MAY DO  TODAY  PER LISA  AT  3:45    Your physician has requested that you have an echocardiogram. Echocardiography is a painless test that uses sound waves to create images of your heart. It provides your doctor with information about the size and shape of your heart and how well your heart's chambers and valves are working. This procedure takes approximately one hour. There are no restrictions for this procedure.   Your physician has requested that you have a lexiscan myoview. For further information please visit [email or web address] Please follow instruction sheet, as given.   Follow-Up: Your physician recommends that you schedule a follow-up appointment in: NEXT AVAILABLE WITH DR Eden Emms  Any Other Special Instructions Will Be Listed Below (If Applicable).

## 2014-05-25 ENCOUNTER — Telehealth: Payer: Self-pay | Admitting: Cardiovascular Disease

## 2014-05-25 NOTE — Telephone Encounter (Signed)
PT  AWARE OF  CT RESULTS ./CY 

## 2014-05-25 NOTE — Telephone Encounter (Signed)
Follow Up  Pt returning Christine's phone call.  

## 2014-05-27 ENCOUNTER — Other Ambulatory Visit: Payer: Self-pay | Admitting: *Deleted

## 2014-05-27 ENCOUNTER — Telehealth: Payer: Self-pay | Admitting: *Deleted

## 2014-05-27 MED ORDER — AMLODIPINE BESYLATE 5 MG PO TABS: 5.0000 mg | ORAL_TABLET | Freq: Every day | ORAL | Status: DC

## 2014-05-27 MED ORDER — MEDROXYPROGESTERONE ACETATE 2.5 MG PO TABS: 2.5000 mg | ORAL_TABLET | Freq: Every day | ORAL | Status: DC

## 2014-05-27 MED ORDER — FUROSEMIDE 20 MG PO TABS
20.0000 mg | ORAL_TABLET | Freq: Every day | ORAL | Status: DC
Start: 2014-05-27 — End: 2014-10-31

## 2014-05-27 MED ORDER — LISINOPRIL 20 MG PO TABS: 20.0000 mg | ORAL_TABLET | Freq: Every day | ORAL | Status: DC

## 2014-05-27 NOTE — Telephone Encounter (Signed)
Patient requests refills on all medications. Ok to refill the provera? Please advise. Thanks, MI

## 2014-05-27 NOTE — Telephone Encounter (Signed)
JUST  GIVE   1-2  MONTHS   WORTH PT  NEEDS  TO HAVE FUTURE   REFILLS  DONE BY PMD .Zack Seal

## 2014-06-15 ENCOUNTER — Ambulatory Visit (HOSPITAL_COMMUNITY): Payer: Self-pay | Attending: Cardiology

## 2014-06-15 ENCOUNTER — Other Ambulatory Visit: Payer: Self-pay

## 2014-06-15 DIAGNOSIS — R29898 Other symptoms and signs involving the musculoskeletal system: Secondary | ICD-10-CM | POA: Insufficient documentation

## 2014-06-15 DIAGNOSIS — I517 Cardiomegaly: Secondary | ICD-10-CM

## 2014-06-17 ENCOUNTER — Other Ambulatory Visit: Payer: Self-pay | Admitting: *Deleted

## 2014-06-17 MED ORDER — CARVEDILOL 6.25 MG PO TABS: 6.2500 mg | ORAL_TABLET | Freq: Two times a day (BID) | ORAL | Status: DC

## 2014-06-24 ENCOUNTER — Telehealth (HOSPITAL_COMMUNITY): Payer: Self-pay | Admitting: *Deleted

## 2014-06-24 NOTE — Telephone Encounter (Signed)
Patient given detailed instructions per Myocardial Perfusion Study Information Sheet for test on 06/29/14  at 7:30. Patient Notified to arrive 15 minutes early, and that it is imperative to arrive on time for appointment to keep from having the test rescheduled. Patient verbalized understanding. Antionette Char, RN

## 2014-06-29 ENCOUNTER — Encounter (HOSPITAL_COMMUNITY): Payer: Self-pay

## 2014-07-09 ENCOUNTER — Telehealth (HOSPITAL_COMMUNITY): Payer: Self-pay | Admitting: *Deleted

## 2014-07-09 NOTE — Telephone Encounter (Signed)
Left message on voicemail in reference to upcoming appointment scheduled for 07/14/14. Phone number given for a call back so details instructions can be given. Rafia Shedden J Strauch, RN 

## 2014-07-13 ENCOUNTER — Telehealth (HOSPITAL_COMMUNITY): Payer: Self-pay | Admitting: *Deleted

## 2014-07-13 NOTE — Telephone Encounter (Signed)
Left message on voicemail in reference to upcoming appointment scheduled for 07/14/14. Phone number given for a call back so details instructions can be given. Keyion Knack W   

## 2014-07-14 ENCOUNTER — Encounter (HOSPITAL_COMMUNITY): Payer: Self-pay

## 2014-08-03 ENCOUNTER — Ambulatory Visit: Payer: Self-pay | Admitting: Cardiovascular Disease

## 2014-09-24 ENCOUNTER — Ambulatory Visit: Payer: Self-pay | Admitting: Cardiovascular Disease

## 2014-10-28 ENCOUNTER — Other Ambulatory Visit: Payer: Self-pay | Admitting: Cardiovascular Disease

## 2014-10-29 ENCOUNTER — Other Ambulatory Visit: Payer: Self-pay | Admitting: Cardiovascular Disease

## 2014-10-31 ENCOUNTER — Other Ambulatory Visit: Payer: Self-pay | Admitting: Cardiovascular Disease

## 2014-11-20 ENCOUNTER — Telehealth: Payer: Self-pay | Admitting: *Deleted

## 2014-11-20 DIAGNOSIS — R911 Solitary pulmonary nodule: Secondary | ICD-10-CM

## 2014-11-20 NOTE — Telephone Encounter (Signed)
LM TO CALL BACK  RE   THE NEED  FOR REPEAT   CHEST  CT   F/U  LUNG NODULE .Jenna Beltran

## 2014-11-24 NOTE — Telephone Encounter (Signed)
LEFT  VOICE  MAIL THAT  PT NEEDS TO CALL OFFICE  BACK AND SCHEDULE  CHEST   CT   TO  F/U ON LUNG  NODULE .Jenna Beltran

## 2015-01-02 ENCOUNTER — Emergency Department (HOSPITAL_COMMUNITY)
Admission: EM | Admit: 2015-01-02 | Discharge: 2015-01-02 | Disposition: A | Discharge status: Home / Self Care | Payer: Self-pay | Attending: Emergency Medicine | Admitting: Emergency Medicine

## 2015-01-02 ENCOUNTER — Emergency Department (HOSPITAL_COMMUNITY): Payer: Self-pay

## 2015-01-02 ENCOUNTER — Encounter (HOSPITAL_COMMUNITY): Payer: Self-pay | Admitting: Emergency Medicine

## 2015-01-02 DIAGNOSIS — Z79899 Other long term (current) drug therapy: Secondary | ICD-10-CM | POA: Insufficient documentation

## 2015-01-02 DIAGNOSIS — R062 Wheezing: Secondary | ICD-10-CM

## 2015-01-02 DIAGNOSIS — J069 Acute upper respiratory infection, unspecified: Secondary | ICD-10-CM | POA: Insufficient documentation

## 2015-01-02 DIAGNOSIS — R059 Cough, unspecified: Secondary | ICD-10-CM

## 2015-01-02 DIAGNOSIS — B349 Viral infection, unspecified: Secondary | ICD-10-CM | POA: Insufficient documentation

## 2015-01-02 DIAGNOSIS — R0602 Shortness of breath: Secondary | ICD-10-CM

## 2015-01-02 DIAGNOSIS — R05 Cough: Secondary | ICD-10-CM

## 2015-01-02 DIAGNOSIS — I252 Old myocardial infarction: Secondary | ICD-10-CM | POA: Insufficient documentation

## 2015-01-02 LAB — CBC WITH DIFFERENTIAL/PLATELET
BASOS ABS: 0 10*3/uL (ref 0.0–0.1)
BASOS PCT: 0 %
EOS ABS: 0.1 10*3/uL (ref 0.0–0.7)
Eosinophils Relative: 1 %
HEMATOCRIT: 45.1 % (ref 36.0–46.0)
Hemoglobin: 15.2 g/dL — ABNORMAL HIGH (ref 12.0–15.0)
Lymphocytes Relative: 16 %
Lymphs Abs: 1.2 10*3/uL (ref 0.7–4.0)
MCH: 33.1 pg (ref 26.0–34.0)
MCHC: 33.7 g/dL (ref 30.0–36.0)
MCV: 98.3 fL (ref 78.0–100.0)
MONO ABS: 0.6 10*3/uL (ref 0.1–1.0)
MONOS PCT: 8 %
NEUTROS ABS: 5.4 10*3/uL (ref 1.7–7.7)
Neutrophils Relative %: 75 %
PLATELETS: 190 10*3/uL (ref 150–400)
RBC: 4.59 MIL/uL (ref 3.87–5.11)
RDW: 13.2 % (ref 11.5–15.5)
WBC: 7.3 10*3/uL (ref 4.0–10.5)

## 2015-01-02 LAB — BASIC METABOLIC PANEL
ANION GAP: 10 (ref 5–15)
BUN: 14 mg/dL (ref 6–20)
CALCIUM: 9.9 mg/dL (ref 8.9–10.3)
CO2: 25 mmol/L (ref 22–32)
CREATININE: 0.59 mg/dL (ref 0.44–1.00)
Chloride: 103 mmol/L (ref 101–111)
Glucose, Bld: 105 mg/dL — ABNORMAL HIGH (ref 65–99)
Potassium: 3.8 mmol/L (ref 3.5–5.1)
SODIUM: 138 mmol/L (ref 135–145)

## 2015-01-02 LAB — I-STAT TROPONIN, ED: TROPONIN I, POC: 0.02 ng/mL (ref 0.00–0.08)

## 2015-01-02 LAB — BRAIN NATRIURETIC PEPTIDE: B NATRIURETIC PEPTIDE 5: 624.1 pg/mL — AB (ref 0.0–100.0)

## 2015-01-02 MED ORDER — ALBUTEROL SULFATE (2.5 MG/3ML) 0.083% IN NEBU
5.0000 mg | INHALATION_SOLUTION | Freq: Once | RESPIRATORY_TRACT | Status: AC
  Administered 2015-01-02: 5 mg via RESPIRATORY_TRACT
  Filled 2015-01-02: qty 6

## 2015-01-02 MED ORDER — IPRATROPIUM BROMIDE 0.02 % IN SOLN
0.5000 mg | Freq: Once | RESPIRATORY_TRACT | Status: AC
  Administered 2015-01-02: 0.5 mg via RESPIRATORY_TRACT
  Filled 2015-01-02: qty 2.5

## 2015-01-02 MED ORDER — ALBUTEROL SULFATE HFA 108 (90 BASE) MCG/ACT IN AERS: 2.0000 | INHALATION_SPRAY | RESPIRATORY_TRACT | Status: DC | PRN

## 2015-01-02 NOTE — ED Notes (Signed)
M. Street, PA, made aware of pt's pain 9/10.

## 2015-01-02 NOTE — ED Notes (Signed)
Dwan Bolt PA at bedside.

## 2015-01-02 NOTE — Discharge Instructions (Signed)
Continue to stay well-hydrated. Gargle warm salt water and spit it out. Continue to alternate between Tylenol and Ibuprofen for pain or fever. Use Mucinex for cough suppression/expectoration of mucus. Use netipot and flonase to help with nasal congestion. May consider over-the-counter Benadryl or other antihistamine to decrease secretions and for watery itchy eyes. Use inhaler as directed, as needed for cough/chest congestion. Followup with [facility] in 5-7 days to establish medical care and for recheck of ongoing symptoms. Return to emergency department for emergent changing or worsening of symptoms.   Cough, Adult Coughing is a reflex that clears your throat and your airways. Coughing helps to heal and protect your lungs. It is normal to cough occasionally, but a cough that happens with other symptoms or lasts a long time may be a sign of a condition that needs treatment. A cough may last only 2-3 weeks (acute), or it may last longer than 8 weeks (chronic). CAUSES Coughing is commonly caused by:  Breathing in substances that irritate your lungs.  A viral or bacterial respiratory infection.  Allergies.  Asthma.  Postnasal drip.  Smoking.  Acid backing up from the stomach into the esophagus (gastroesophageal reflux).  Certain medicines.  Chronic lung problems, including COPD (or rarely, lung cancer).  Other medical conditions such as heart failure. HOME CARE INSTRUCTIONS  Pay attention to any changes in your symptoms. Take these actions to help with your discomfort:  Take medicines only as told by your health care provider.  If you were prescribed an antibiotic medicine, take it as told by your health care provider. Do not stop taking the antibiotic even if you start to feel better.  Talk with your health care provider before you take a cough suppressant medicine.  Drink enough fluid to keep your urine clear or pale yellow.  If the air is dry, use a cold steam  vaporizer or humidifier in your bedroom or your home to help loosen secretions.  Avoid anything that causes you to cough at work or at home.  If your cough is worse at night, try sleeping in a semi-upright position.  Avoid cigarette smoke. If you smoke, quit smoking. If you need help quitting, ask your health care provider.  Avoid caffeine.  Avoid alcohol.  Rest as needed. SEEK MEDICAL CARE IF:   You have new symptoms.  You cough up pus.  Your cough does not get better after 2-3 weeks, or your cough gets worse.  You cannot control your cough with suppressant medicines and you are losing sleep.  You develop pain that is getting worse or pain that is not controlled with pain medicines.  You have a fever.  You have unexplained weight loss.  You have night sweats. SEEK IMMEDIATE MEDICAL CARE IF:  You cough up blood.  You have difficulty breathing.  Your heartbeat is very fast.   This information is not intended to replace advice given to you by your health care provider. Make sure you discuss any questions you have with your health care provider.   Document Released: 06/17/2010 Document Revised: 09/09/2014 Document Reviewed: 02/25/2014 Elsevier Interactive Patient Education 2016 [employer].  How to Use an Inhaler Using your inhaler correctly is very important. Good technique will make sure that the medicine reaches your lungs.  HOW TO USE AN INHALER:  Take the cap off the inhaler.  If this is the first time using your inhaler, you need to prime it. Shake the inhaler for 5 seconds. Release four puffs  into the air, away from your face. Ask your doctor for help if you have questions.  Shake the inhaler for 5 seconds.  Turn the inhaler so the bottle is above the mouthpiece.  Put your pointer finger on top of the bottle. Your thumb holds the bottom of the inhaler.  Open your mouth.  Either hold the inhaler away from your mouth (the width of 2 fingers) or place  your lips tightly around the mouthpiece. Ask your doctor which way to use your inhaler.  Breathe out as much air as possible.  Breathe in and push down on the bottle 1 time to release the medicine. You will feel the medicine go in your mouth and throat.  Continue to take a deep breath in very slowly. Try to fill your lungs.  After you have breathed in completely, hold your breath for 10 seconds. This will help the medicine to settle in your lungs. If you cannot hold your breath for 10 seconds, hold it for as long as you can before you breathe out.  Breathe out slowly, through pursed lips. Whistling is an example of pursed lips.  If your doctor has told you to take more than 1 puff, wait at least 15-30 seconds between puffs. This will help you get the best results from your medicine. Do not use the inhaler more than your doctor tells you to.  Put the cap back on the inhaler.  Follow the directions from your doctor or from the inhaler package about cleaning the inhaler. If you use more than one inhaler, ask your doctor which inhalers to use and what order to use them in. Ask your doctor to help you figure out when you will need to refill your inhaler.  If you use a steroid inhaler, always rinse your mouth with water after your last puff, gargle and spit out the water. Do not swallow the water. GET HELP IF:  The inhaler medicine only partially helps to stop wheezing or shortness of breath.  You are having trouble using your inhaler.  You have some increase in thick spit (phlegm). GET HELP RIGHT AWAY IF:  The inhaler medicine does not help your wheezing or shortness of breath or you have tightness in your chest.  You have dizziness, headaches, or fast heart rate.  You have chills, fever, or night sweats.  You have a large increase of thick spit, or your thick spit is bloody. MAKE SURE YOU:   Understand these instructions.  Will watch your condition.  Will get help right away if  you are not doing well or get worse.   This information is not intended to replace advice given to you by your health care provider. Make sure you discuss any questions you have with your health care provider.   Document Released: 09/28/2007 Document Revised: 10/09/2012 Document Reviewed: 07/18/2012 Elsevier Interactive Patient Education 2016 Else[employer] Inc.   Viral Infections A virus is a type of germ. Viruses can cause:  Minor sore throats.  Aches and pains.  Headaches.  Runny nose.  Rashes.  Watery eyes.  Tiredness.  Coughs.  Loss of appetite.  Feeling sick to your stomach (nausea).  Throwing up (vomiting).  Watery poop (diarrhea). HOME CARE   Only take medicines as told by your doctor.  Drink enough water and fluids to keep your pee (urine) clear or pale yellow. Sports drinks are a good choice.  Get plenty of rest and eat healthy. Soups and broths with crackers or rice are  fine. GET HELP RIGHT AWAY IF:   You have a very bad headache.  You have shortness of breath.  You have chest pain or neck pain.  You have an unusual rash.  You cannot stop throwing up.  You have watery poop that does not stop.  You cannot keep fluids down.  You or your child has a temperature by mouth above 102 F (38.9 C), not controlled by medicine.  Your baby is older than 3 months with a rectal temperature of 102 F (38.9 C) or higher.  Your baby is 44 months old or younger with a rectal temperature of 100.4 F (38 C) or higher. MAKE SURE YOU:   Understand these instructions.  Will watch this condition.  Will get help right away if you are not doing well or get worse.   This information is not intended to replace advice given to you by your health care provider. Make sure you discuss any questions you have with your health care provider.   Document Released: 12/02/2007 Document Revised: 03/13/2011 Document Reviewed: 05/27/2014 Elsevier Interactive Patient Education  [employer]

## 2015-01-02 NOTE — ED Notes (Signed)
Pt. Stated, I've had congestion , and a cough for 3 days.

## 2015-01-02 NOTE — ED Provider Notes (Signed)
CSN: [id number]     Arrival date & time 01/02/15  1335 History   First MD Initiated Contact with Patient 01/02/15 1632     Chief Complaint  Patient presents with  . Nasal Congestion  . Cough     (Consider location/radiation/quality/duration/timing/severity/associated sxs/prior Treatment) HPI Comments: Jenna Beltran is a 49 y.o. female with a PMHx of MI, HTN, LBBB, CHF, and lung nodule on CXR, who presents to the ED with complaints of 3 days of gradually worsening URI symptoms including cough with green sputum production, sinus congestion, green rhinorrhea, sore throat, body aches, shortness of breath, and wheezing. No known aggravating factors, and symptoms have been unrelieved with DayQuil. She is a nonsmoker. She works as a Interior and spatial designer and has been in contact with multiple sick people. She denies any fevers, chills, chest pain, drooling, trismus, ear pain or drainage, leg swelling, recent travel/surgery/immobilization, history of DVT/PE, estrogen use, abdominal pain, nausea, vomiting, diarrhea, constipation, dysuria, hematuria, numbness, tingling, or focal weakness.  Patient is a 49 y.o. female presenting with cough. The history is provided by the patient. No language interpreter was used.  Cough Cough characteristics:  Productive Sputum characteristics:  Green Severity:  Moderate Onset quality:  Gradual Duration:  3 days Timing:  Constant Progression:  Worsening Chronicity:  New Smoker: no   Context: sick contacts and upper respiratory infection   Relieved by:  Nothing Worsened by:  Nothing tried Ineffective treatments: dayquil. Associated symptoms: myalgias (generalized body aches), rhinorrhea, shortness of breath, sinus congestion, sore throat and wheezing   Associated symptoms: no chest pain, no chills, no ear pain and no fever   Risk factors: no recent travel     Past Medical History  Diagnosis Date  . MI (myocardial infarction) [facility])    Past Surgical History  Procedure  Laterality Date  . Gastric bypass    . Breast surgery    . Tummy tuck     No family history on file. Social History  Substance Use Topics  . Smoking status: Never Smoker   . Smokeless tobacco: None  . Alcohol Use: Yes   OB History    No data available     Review of Systems  Constitutional: Negative for fever and chills.  HENT: Positive for rhinorrhea, sinus pressure and sore throat. Negative for drooling, ear discharge, ear pain and trouble swallowing.   Respiratory: Positive for cough, shortness of breath and wheezing.   Cardiovascular: Negative for chest pain and leg swelling.  Gastrointestinal: Negative for nausea, vomiting, abdominal pain, diarrhea and constipation.  Genitourinary: Negative for dysuria and hematuria.  Musculoskeletal: Positive for myalgias (generalized body aches). Negative for arthralgias.  Skin: Negative for color change.  Allergic/Immunologic: Negative for immunocompromised state.  Neurological: Negative for weakness and numbness.  Psychiatric/Behavioral: Negative for confusion.   10 Systems reviewed and are negative for acute change except as noted in the HPI.    Allergies  Review of patient's allergies indicates no known allergies.  Home Medications   Prior to Admission medications   Medication Sig Start Date End Date Taking? Authorizing Provider  carvedilol (COREG) 6.25 MG tablet Take 1 tablet (6.25 mg total) by mouth 2 (two) times daily. 06/17/14   Wendall Stade, MD  furosemide (LASIX) 20 MG tablet TAKE ONE TABLET BY MOUTH ONCE DAILY 10/28/14   Wendall Stade, MD  furosemide (LASIX) 20 MG tablet TAKE ONE TABLET BY MOUTH ONCE DAILY 11/02/14   Wendall Stade, MD  ibuprofen (ADVIL,MOTRIN) 200 MG tablet Take 400  mg by mouth every 6 (six) hours as needed for headache, mild pain, moderate pain or cramping.     Historical Provider, MD  lisinopril (PRINIVIL,ZESTRIL) 20 MG tablet Take 1 tablet (20 mg total) by mouth daily. 05/27/14   Wendall Stade, MD   medroxyPROGESTERone (PROVERA) 2.5 MG tablet Take 1 tablet (2.5 mg total) by mouth daily. 05/27/14   Wendall Stade, MD  potassium chloride SA (K-DUR,KLOR-CON) 20 MEQ tablet Take 1 tablet (20 mEq total) by mouth 2 (two) times daily. Patient not taking: Reported on 05/21/2014 04/12/14   Cathren Laine, MD   BP 156/91 mmHg  Pulse 87  Temp(Src) 98.9 F (37.2 C) (Oral)  Resp 17  Ht 5\' 1"  (1.549 m)  Wt 88.451 kg  BMI 36.86 kg/m2  SpO2 95% Physical Exam  Constitutional: She is oriented to person, place, and time. Vital signs are normal. She appears well-developed and well-nourished.  Non-toxic appearance. No distress.  Afebrile, nontoxic, NAD  HENT:  Head: Normocephalic and atraumatic.  Nose: Mucosal edema and rhinorrhea present.  Mouth/Throat: Uvula is midline, oropharynx is clear and moist and mucous membranes are normal. No trismus in the jaw. No uvula swelling.  Nose with mucosal edema and rhinorrhea. Oropharynx clear and moist, without uvular swelling or deviation, no trismus or drooling, no tonsillar swelling or erythema, no exudates.    Eyes: Conjunctivae and EOM are normal. Right eye exhibits no discharge. Left eye exhibits no discharge.  Neck: Normal range of motion. Neck supple.  Cardiovascular: Normal rate, regular rhythm, normal heart sounds and intact distal pulses.  Exam reveals no gallop and no friction rub.   No murmur heard. RRR, nl s1/s2, no m/r/g, distal pulses intact, no pedal edema   Pulmonary/Chest: Effort normal. No respiratory distress. She has no decreased breath sounds. She has wheezes. She has no rhonchi. She has no rales.  Slight expiratory wheezing noted throughout lower lung fields, no rhonchi or rales, no hypoxia or increased WOB, speaking in full sentences, SpO2 95% on RA   Abdominal: Soft. Normal appearance and bowel sounds are normal. She exhibits no distension. There is no tenderness. There is no rigidity, no rebound, no guarding, no CVA tenderness, no tenderness  at McBurney's point and negative Murphy's sign.  Musculoskeletal: Normal range of motion.  Neurological: She is alert and oriented to person, place, and time. She has normal strength. No sensory deficit.  Skin: Skin is warm, dry and intact. No rash noted.  Psychiatric: She has a normal mood and affect.  Nursing note and vitals reviewed.   ED Course  Procedures (including critical care time) Labs Review Labs Reviewed  BRAIN NATRIURETIC PEPTIDE - Abnormal; Notable for the following:    B Natriuretic Peptide 624.1 (*)    All other components within normal limits  CBC WITH DIFFERENTIAL/PLATELET - Abnormal; Notable for the following:    Hemoglobin 15.2 (*)    All other components within normal limits  BASIC METABOLIC PANEL - Abnormal; Notable for the following:    Glucose, Bld 105 (*)    All other components within normal limits  I-STAT TROPOININ, ED    Imaging Review Dg Chest 2 View  01/02/2015  CLINICAL DATA:  50 year old female with productive cough with green sputum and wheezing for the past 2-3 days. Body aches. No fever. EXAM: CHEST  2 VIEW COMPARISON:  Chest x-ray 04/12/2014. FINDINGS: Lung volumes are normal. No consolidative airspace disease. No pleural effusions. No pneumothorax. No pulmonary nodule or mass noted. Pulmonary vasculature and  the cardiomediastinal silhouette are within normal limits. IMPRESSION: No radiographic evidence of acute cardiopulmonary disease. Electronically Signed   By: Trudie Reed M.D.   On: 01/02/2015 18:23   I have personally reviewed and evaluated these images and lab results as part of my medical decision-making.   EKG Interpretation None      MDM   Final diagnoses:  Cough  SOB (shortness of breath)  Wheezing  URI (upper respiratory infection)  Viral syndrome    49 y.o. female here with sinus congestion, body aches, sore throat, rhinorrhea, SOB, and wheezing x3 days. Slight expiratory wheezing noted on exam. No pedal edema. Pt with  hx of CHF, taking lasix as directed. Likely viral URI but given hx, will get basic labs, BNP, and CXR. Will give nebs. Will reassess shortly.  6:34 PM BNP elevated at 624 but less than prior, and CXR clear without evidence of CHF exacerbation. Likely viral URI. Labs unremarkable including neg trop. Pt feels better after breathing treatment, and lung sounds improved. Discussed OTC remedies for symptoms, will rx inhaler for SOB/wheezing. F/up with [facility] in 1wk for recheck and to establish care. I explained the diagnosis and have given explicit precautions to return to the ER including for any other new or worsening symptoms. The patient understands and accepts the medical plan as it's been dictated and I have answered their questions. Discharge instructions concerning home care and prescriptions have been given. The patient is STABLE and is discharged to home in good condition.   BP 140/60 mmHg  Pulse 87  Temp(Src) 98.9 F (37.2 C) (Oral)  Resp 17  Ht  (1.549 m)  Wt 88.451 kg  BMI 36.86 kg/m2  SpO2 99%  Meds ordered this encounter  Medications  . albuterol (PROVENTIL) (2.5 MG/3ML) 0.083% nebulizer solution 5 mg    Sig:   . ipratropium (ATROVENT) nebulizer solution 0.5 mg    Sig:   . albuterol (PROVENTIL HFA;VENTOLIN HFA) 108 (90 Base) MCG/ACT inhaler    Sig: Inhale 2 puffs into the lungs every 2 (two) hours as needed for wheezing or shortness of breath (cough).    Dispense:  1 Inhaler    Refill:  0    Order Specific Question:  Supervising Provider    Answer:  Eber Hong [[id number]]     Georgetta Crafton Camprubi-Soms, PA-C 01/02/15 1835  Derwood Kaplan, MD 01/06/15 2345

## 2015-01-14 ENCOUNTER — Emergency Department (HOSPITAL_COMMUNITY)
Admission: EM | Admit: 2015-01-14 | Discharge: 2015-01-14 | Disposition: A | Discharge status: Home / Self Care | Payer: Self-pay | Attending: Emergency Medicine | Admitting: Emergency Medicine

## 2015-01-14 ENCOUNTER — Encounter (HOSPITAL_COMMUNITY): Payer: Self-pay | Admitting: *Deleted

## 2015-01-14 DIAGNOSIS — Y9389 Activity, other specified: Secondary | ICD-10-CM | POA: Insufficient documentation

## 2015-01-14 DIAGNOSIS — X58XXXA Exposure to other specified factors, initial encounter: Secondary | ICD-10-CM | POA: Insufficient documentation

## 2015-01-14 DIAGNOSIS — Y998 Other external cause status: Secondary | ICD-10-CM | POA: Insufficient documentation

## 2015-01-14 DIAGNOSIS — Y9289 Other specified places as the place of occurrence of the external cause: Secondary | ICD-10-CM | POA: Insufficient documentation

## 2015-01-14 DIAGNOSIS — L089 Local infection of the skin and subcutaneous tissue, unspecified: Secondary | ICD-10-CM

## 2015-01-14 DIAGNOSIS — L03116 Cellulitis of left lower limb: Secondary | ICD-10-CM | POA: Insufficient documentation

## 2015-01-14 DIAGNOSIS — Z23 Encounter for immunization: Secondary | ICD-10-CM | POA: Insufficient documentation

## 2015-01-14 DIAGNOSIS — Z79899 Other long term (current) drug therapy: Secondary | ICD-10-CM | POA: Insufficient documentation

## 2015-01-14 DIAGNOSIS — S90852A Superficial foreign body, left foot, initial encounter: Secondary | ICD-10-CM | POA: Insufficient documentation

## 2015-01-14 DIAGNOSIS — I252 Old myocardial infarction: Secondary | ICD-10-CM | POA: Insufficient documentation

## 2015-01-14 MED ORDER — CEPHALEXIN 500 MG PO CAPS: 500.0000 mg | ORAL_CAPSULE | Freq: Four times a day (QID) | ORAL | Status: DC

## 2015-01-14 MED ORDER — SULFAMETHOXAZOLE-TRIMETHOPRIM 800-160 MG PO TABS: 1.0000 | ORAL_TABLET | Freq: Two times a day (BID) | ORAL | Status: DC

## 2015-01-14 MED ORDER — SULFAMETHOXAZOLE-TRIMETHOPRIM 800-160 MG PO TABS: 1.0000 | ORAL_TABLET | Freq: Two times a day (BID) | ORAL | Status: AC

## 2015-01-14 MED ORDER — LIDOCAINE HCL (PF) 1 % IJ SOLN
5.0000 mL | Freq: Once | INTRAMUSCULAR | Status: AC
  Administered 2015-01-14: 5 mL
  Filled 2015-01-14: qty 5

## 2015-01-14 MED ORDER — IBUPROFEN 800 MG PO TABS: 800.0000 mg | ORAL_TABLET | Freq: Three times a day (TID) | ORAL | Status: DC | PRN

## 2015-01-14 MED ORDER — HYDROCODONE-ACETAMINOPHEN 5-325 MG PO TABS: 1.0000 | ORAL_TABLET | ORAL | Status: DC | PRN

## 2015-01-14 MED ORDER — TETANUS-DIPHTH-ACELL PERTUSSIS 5-2.5-18.5 LF-MCG/0.5 IM SUSP
0.5000 mL | Freq: Once | INTRAMUSCULAR | Status: AC
  Administered 2015-01-14: 0.5 mL via INTRAMUSCULAR
  Filled 2015-01-14: qty 0.5

## 2015-01-14 NOTE — ED Notes (Signed)
I&D tray set up at bedside.

## 2015-01-14 NOTE — Discharge Instructions (Signed)
Read the information below.  Use the prescribed medication as directed.  Please discuss all new medications with your pharmacist.  Do not take additional tylenol while taking the prescribed pain medication to avoid overdose.  You may return to the Emergency Department at any time for worsening condition or any new symptoms that concern you.    If you develop increased redness, swelling, pus draining from the wound, worsening pain, or fevers greater than 100.4, return to the ER immediately for a recheck.     Cellulitis Cellulitis is an infection of the skin and the tissue beneath it. The infected area is usually red and tender. Cellulitis occurs most often in the arms and lower legs.  CAUSES  Cellulitis is caused by bacteria that enter the skin through cracks or cuts in the skin. The most common types of bacteria that cause cellulitis are staphylococci and streptococci. SIGNS AND SYMPTOMS   Redness and warmth.  Swelling.  Tenderness or pain.  Fever. DIAGNOSIS  Your health care provider can usually determine what is wrong based on a physical exam. Blood tests may also be done. TREATMENT  Treatment usually involves taking an antibiotic medicine. HOME CARE INSTRUCTIONS   Take your antibiotic medicine as directed by your health care provider. Finish the antibiotic even if you start to feel better.  Keep the infected arm or leg elevated to reduce swelling.  Apply a warm cloth to the affected area up to 4 times per day to relieve pain.  Take medicines only as directed by your health care provider.  Keep all follow-up visits as directed by your health care provider. SEEK MEDICAL CARE IF:   You notice red streaks coming from the infected area.  Your red area gets larger or turns dark in color.  Your bone or joint underneath the infected area becomes painful after the skin has healed.  Your infection returns in the same area or another area.  You notice a swollen bump in the infected  area.  You develop new symptoms.  You have a fever. SEEK IMMEDIATE MEDICAL CARE IF:   You feel very sleepy.  You develop vomiting or diarrhea.  You have a general ill feeling (malaise) with muscle aches and pains.   This information is not intended to replace advice given to you by your health care provider. Make sure you discuss any questions you have with your health care provider.   Document Released: 09/28/2004 Document Revised: 09/09/2014 Document Reviewed: 03/06/2011 Elsevier Interactive Patient Education 2016 [employer].    Emergency Department Resource Guide 1) Find a Doctor and Pay Out of Pocket Although you won't have to find out who is covered by your insurance plan, it is a good idea to ask around and get recommendations. You will then need to call the office and see if the doctor you have chosen will accept you as a new patient and what types of options they offer for patients who are self-pay. Some doctors offer discounts or will set up payment plans for their patients who do not have insurance, but you will need to ask so you aren't surprised when you get to your appointment.  2) Contact Your Local Health Department Not all health departments have doctors that can see patients for sick visits, but many do, so it is worth a call to see if yours does. If you don't know where your local health department is, you can check in your phone book. The CDC also has a tool to help  you locate your state's health department, and many state websites also have listings of all of their local health departments.  3) Find a Walk-in Clinic If your illness is not likely to be very severe or complicated, you may want to try a walk in clinic. These are popping up all over the country in pharmacies, drugstores, and shopping centers. They're usually staffed by nurse practitioners or physician assistants that have been trained to treat common illnesses and complaints. They're usually fairly  quick and inexpensive. However, if you have serious medical issues or chronic medical problems, these are probably not your best option.  No Primary Care Doctor: - Call Health Connect at  [phone] - they can help you locate a primary care doctor that  accepts your insurance, provides certain services, etc. - Physician Referral Service- [phone]  Chronic Pain Problems: Organization         Address  Phone   Notes  [facility]  [phone] Patients need to be referred by their primary care doctor.   Medication Assistance: Organization         Address  Phone   Notes  [facility] Medication [facility] [address] [place] [place], [place] [address] [phone] --Must be a resident of [facility] -- Must have NO insurance coverage whatsoever (no Medicaid/ Medicare, etc.) -- The pt. MUST have a primary care doctor that directs their care regularly and follows them in the community   [facility]  [phone]   [employer]  [phone]    Agencies that provide inexpensive medical care: Organization         Address  Phone   Notes  [facility]  [phone]   [facility]    [phone]   [facility] [address] [place], [place] [address] [phone]   [facility] [address] [place] [phone]   Planned Parenthood    [phone]   [facility]    [phone]   [facility] [place]  [address], [place] Phone:  [phone], Fax:  [phone] Hours of Operation:  9 am - 6 pm, M-F.  Also accepts Medicaid/Medicare and self-pay.  [facility]  [address], [place] Phone: [phone], Fax: [phone]. Hours of Operation:  8:30 am - 5:30 pm, M-F.  Also accepts Medicaid and self-pay.  [facility] [place] [address] [place] Phone: [phone]    [facility] [address] Natasha Bence [place], [place] [phone], Ext. [phone] Mondays & Thursdays: 7-9 AM.  First 15 patients are seen on a first come, first serve basis.    Medicaid-accepting [facility] Providers:  Organization         Address  Phone   Notes  [facility] [address], [place] [phone] Also accepts self-pay patients.  [facility] [address] [place], [place]  [phone]   [facility] [address], [place] [phone]   [facility] [address], [place] [phone]   Renaye Rakers [address], [place]   [phone] Only accepts [place] Access [place] patients after they have their name applied to their card.   Self-Pay (no insurance) in [facility]:  Organization  Address  Phone   Notes  Sickle Cell Patients, [facility] [place] [phone]   [facility] [place] [phone]   [facility] [place]  [address], [place] [phone]   [facility]/Dr. Osei-Bonsu  [address], [place] [address], [place] [phone] Phone number for both [place] and [place] locations is the same.  [facility] [address] [place] [phone]   [facility] [address], [place] or [address] [phone]   [facility] [address] [place] [phone], phone; [phone], fax Sees patients 1st and 3rd Saturday of every month.  Must not qualify for public or private insurance (i.e. Medicaid, Medicare, [place] Health Choice, Veterans' Benefits)  Household income should be no more than 200% of the poverty level The clinic cannot treat you if you are  pregnant or think you are pregnant  Sexually transmitted diseases are not treated at the clinic.    Dental Care: Organization         Address  Phone  Notes  [facility] [place] [phone] Accepts children up to age 56 who are enrolled in [place] or [place]; pregnant women with a Medicaid card; and children who have applied for Medicaid or [place] Health Choice, but were declined, whose parents can pay a reduced fee at time of service.  [facility]  [address], [place] [phone] Accepts children up to age 30 who are enrolled in [place] or [place]; pregnant women with a Medicaid card; and children who have applied for Medicaid or [place] Health Choice, but were declined, whose parents can pay a reduced fee at time of service.  [facility]  [place] [phone] Patients are seen by appointment only. Walk-ins are not accepted. [place] will see patients 32 years of age and older. Monday - Tuesday (8am-5pm) Most Wednesdays (8:30-5pm) $30 per visit, cash only  [facility]  [address], [facility] [place] [facility] [phone] Patients are seen by appointment only. Walk-ins are not accepted. [place] will see patients 61 years of age and older. One Wednesday Evening (Monthly: Volunteer Based).  $30 per visit, cash only  [place]  [phone] for adults; Children under age 24, call Graduate Pediatric Dentistry at [phone]. Children aged 39-14, please call [phone] to request a pediatric application.  Dental services are provided in all areas of dental care including fillings, crowns and bridges, complete and partial dentures, implants, gum treatment, root canals, and extractions. Preventive care is also provided. Treatment is provided to  both adults and children. Patients are selected via a lottery and there is often a waiting list.   [facility] [address], [place]  [phone] [email or web address]   [facility] [address] [place], [place] [phone], Ext. [phone] Second and Fourth Thursday of each month, opens at 6:30 AM; Clinic ends at 9 AM.  Patients are seen on a first-come first-served basis, and a limited number are seen during each clinic.   [facility]  [address] [place], [place], [place] [phone]   Eligibility Requirements °You must have lived in [place], [place], or [place] counties for at least the last three months. °  You cannot be eligible for state or federal sponsored healthcare insurance, including Veterans Administration, Medicaid, or Medicare. °  You generally cannot be eligible for healthcare insurance through your employer.  °  How to apply: °Eligibility screenings are held every Tuesday and Wednesday afternoon from 1:00 pm until 4:00 pm. You do not need an appointment for the interview!  °[facility] [address], [place], [place] [phone]   °[facility]  [phone]   °[facility]  [phone]   °[facility]  336-570-6415   ° °Behavioral Health Resources in the Community: °Intensive Outpatient Programs °Organization         Address  Phone  Notes  °[facility] [address], [place], [place] [phone]   °[facility] [address], [place], [place] [phone]   °[facility] [address], [place], [place] ° 336-882-2125   °[facility] [address],  °[place], [place] 1-800-853-5163 or 336-641-4981   °Substance Abuse Resources °Organization         Address  Phone  Notes  °[facility]  336-882-2125   °[facility]  336-784-9470   °[facility]   336-285-9073   °[facility]  336-845-3988   °Residential & Outpatient Substance Abuse Program  1-800-659-3381   °Psychological Services °Organization         Address  Phone  Notes  °[facility]  336- [phone]   °[facility]  336- 378-7881   °[facility] [address], [place] 1-800-853-5163 or 336-641-4981   ° °Mobile Crisis Teams °Organization         Address  Phone  Notes  °[facility], Mobile Crisis Care Unit  1-877-626-1772   °Assertive °[facility] ° [address] [place], [place] 336-834-9664   °Sharon DeEsch [address] °[place] 336-554-5454   ° °Self-Help/Support Groups °Organization         Address  Phone             Notes  °[facility] [place] - variety of support groups  336- 373-1402 Call for more information  °Narcotics Anonymous (NA), [facility] [address], °[place]  2 meetings at this location  ° °Residential Treatment Programs °Organization         Address  Phone  Notes  °[facility] [address],    °[place]  1-866-801-8205   °[facility] ° [address], [place], [place] [phone]   °[facility] [address], [place] 336-845-3988 Admissions: 8am-3pm M-F  °[facility] [address]    °[place], [place] 336-841-1104   °[facility] [address], [place], [place] 336-379-7146   °[facility] [address]  °[place], [place] 336-285-9073   °[facility] - Intensive Outpatient [address], [place], [place] 336-852-3033   °[facility] [address]  °[place], [place] 1-877-615-2722 or 336-784-9470   °[facility] ([facility]) [address] [place], [place] 336-227-7417 Accepts Medicaid  °[facility] [address]  °[place]  1-800-659-3381 Substance Abuse/Addiction Treatment  ° °[place] [facility] Resources °Organization           Address  Phone  Notes  °[facility]  (888) 581-9988   °Julie Brannon, PhD [address] [place], [place]   (336) 349-5553 or (336) 951-0000   °[facility]   [address] °[place], [place] (336) 349-4454   °[facility] [address], [place], [place] (336) 342-8316 Insurance/Medicaid/sponsorship through [facility]  °[facility] [address]                                    [place], [place] (336) 342-8316 Therapy/tele-psych/case  °[facility] [address]  ° [place], [place] (336) [phone]    °Dr. Arfeen  (336) 349-4544   °[facility] [place]  [employer] [place] [facility] 1) [address], [place] °2) [address], [place] °3)  [address], [place] (336) 349-3220 °(336) 342-7768 ° °(336) 342-8140   °[place] Child Abuse Hotline (336) 342-1394 or (336) 342-3537 (After Hours)    ° ° ° °

## 2015-01-14 NOTE — ED Provider Notes (Signed)
CSN: [id number]     Arrival date & time 01/14/15  1159 History  By signing my name below, I, Jarvis Morgan, attest that this documentation has been prepared under the direction and in the presence of Trixie Dredge, PA-C  Electronically Signed: Jarvis Morgan, ED Scribe. 01/14/2015. 1:09 PM.     Chief Complaint  Patient presents with  . Foot Pain  . Leg Pain   The history is provided by the patient. No language interpreter was used.    HPI Comments: Jenna Beltran is a 49 y.o. female who presents to the Emergency Department complaining of constant, moderate, left 3rd toe pain for 2 days. She states the pain radiates up into her left calf. She reports associated swelling and redness throughout the left foot. Pt denies any known direct trauma or injury. She has not taken any medications prior to arrival. Pt states the pain is exacerbated with bearing weight. She denies any recent travel. Pt denies any h/o DVT. She denies any fever, chills, body aches or other associated symptoms.  Past Medical History  Diagnosis Date  . MI (myocardial infarction) [facility])    Past Surgical History  Procedure Laterality Date  . Gastric bypass    . Breast surgery    . Tummy tuck     History reviewed. No pertinent family history. Social History  Substance Use Topics  . Smoking status: Never Smoker   . Smokeless tobacco: None  . Alcohol Use: Yes   OB History    No data available     Review of Systems  Constitutional: Negative for fever and chills.  Gastrointestinal: Negative for nausea and vomiting.  Musculoskeletal: Positive for arthralgias and gait problem.  Skin: Positive for color change. Negative for wound.  Allergic/Immunologic: Negative for immunocompromised state.  Neurological: Negative for weakness and numbness.  Hematological: Does not bruise/bleed easily.  Psychiatric/Behavioral: Negative for self-injury.      Allergies  Review of patient's allergies indicates no known  allergies.  Home Medications   Prior to Admission medications   Medication Sig Start Date End Date Taking? Authorizing Provider  albuterol (PROVENTIL HFA;VENTOLIN HFA) 108 (90 Base) MCG/ACT inhaler Inhale 1 puff into the lungs daily as needed for wheezing or shortness of breath.    Historical Provider, MD  albuterol (PROVENTIL HFA;VENTOLIN HFA) 108 (90 Base) MCG/ACT inhaler Inhale 2 puffs into the lungs every 2 (two) hours as needed for wheezing or shortness of breath (cough). 01/02/15   Mercedes Camprubi-Soms, PA-C  carvedilol (COREG) 6.25 MG tablet Take 1 tablet (6.25 mg total) by mouth 2 (two) times daily. 06/17/14   Wendall Stade, MD  furosemide (LASIX) 20 MG tablet TAKE ONE TABLET BY MOUTH ONCE DAILY Patient not taking: Reported on 01/02/2015 10/28/14   Wendall Stade, MD  furosemide (LASIX) 20 MG tablet TAKE ONE TABLET BY MOUTH ONCE DAILY 11/02/14   Wendall Stade, MD  ibuprofen (ADVIL,MOTRIN) 200 MG tablet Take 600 mg by mouth every 6 (six) hours as needed for headache, mild pain, moderate pain or cramping.     Historical Provider, MD  lisinopril (PRINIVIL,ZESTRIL) 20 MG tablet Take 1 tablet (20 mg total) by mouth daily. 05/27/14   Wendall Stade, MD  medroxyPROGESTERone (PROVERA) 2.5 MG tablet Take 1 tablet (2.5 mg total) by mouth daily. Patient not taking: Reported on 01/02/2015 05/27/14   Wendall Stade, MD  potassium chloride SA (K-DUR,KLOR-CON) 20 MEQ tablet Take 1 tablet (20 mEq total) by mouth 2 (two) times daily. Patient not  taking: Reported on 05/21/2014 04/12/14   Cathren Laine, MD  Pseudoephedrine-APAP-DM (DAYQUIL PO) Take 2 capsules by mouth every 6 (six) hours as needed (cough and cold symptoms).    Historical Provider, MD   Triage Vitals: BP 138/73 mmHg  Pulse 62  Temp(Src) 98.1 F (36.7 C) (Oral)  Resp 20  Ht  (1.549 m)  Wt 195 lb (88.451 kg)  BMI 36.86 kg/m2  SpO2 100%   Physical Exam  Constitutional: She appears well-developed and well-nourished. No distress.   HENT:  Head: Normocephalic and atraumatic.  Neck: Neck supple.  Pulmonary/Chest: Effort normal.  Musculoskeletal:  Left Lower Extremity: Erythema, edema and tenderness to dorsum of left foot. Black linear foreign body in subcunteanous space of plantar aspect of 3rd toe of distal foot. No calf edema or warmth DP pulses intact Sensation intact  Neurological: She is alert.  Skin: She is not diaphoretic.  Nursing note and vitals reviewed.     ED Course  Procedures (including critical care time)  DIAGNOSTIC STUDIES: Oxygen Saturation is 100% on RA, normal by my interpretation.    COORDINATION OF CARE:   Labs Review Labs Reviewed - No data to display  Imaging Review No results found.    EKG Interpretation None      Dr Tawni Carnes preformed excision of splinter.   Procedure note: Discussed risks of bleeding/infection. Patient agreed to proceed with splinter removal. Base of left 3rd metatarsophalangeal joint prepped with iodine. No local anesthesia used. A 25g needle was used to scrap calloused skin at the end of the splinter. A pair of forceps was used to grasp the end and removed. There was an additional splinter noted on the 1st MTP, this was cleaned with alcohol swab and again 25g needle used to scrap calloused skin and removed splinter with forceps. Patient tolerated procedure well, no blood loss. Tawni Carnes, MD 01/14/2015, 2:11 PM PGY-3, [facility]  MDM   Final diagnoses:  Cellulitis of left foot  Foreign body in foot, left, infected, initial encounter    Afebrile, nontoxic patient with left foot cellulitis that began with pain in the left 3rd toe -unknown injury- but I did find an imbedded "hair splinter" in the plantar aspect of the 3rd toe/foot.  Splinter removed by Family medicine resident, wound care by tech.   D/C home with keflex, bactrim, pain medication, wound care, crutches.  Discussed result, findings, treatment, and follow up  with  patient.  Pt given return precautions.  Pt verbalizes understanding and agrees with plan.       I personally performed the services described in this documentation, which was scribed in my presence. The recorded information has been reviewed and is accurate.   Trixie Dredge, PA-C 01/14/15 1529  Trixie Dredge, PA-C 01/14/15 1555  Melene Plan, DO 01/14/15 1812

## 2015-01-14 NOTE — ED Notes (Signed)
Pt reports on Sunday night the LT 3rd toe was sore and on Tuesday the Lt foot was swollen . Pt alos reorts pain all the way up the LT leg. Pt reports she has not traveled over 4 hrs. Pt denies any Hx of DVT.

## 2015-01-26 ENCOUNTER — Emergency Department (HOSPITAL_COMMUNITY): Payer: Self-pay

## 2015-01-26 ENCOUNTER — Emergency Department (HOSPITAL_COMMUNITY)
Admission: EM | Admit: 2015-01-26 | Discharge: 2015-01-26 | Disposition: A | Discharge status: Home / Self Care | Payer: Self-pay | Attending: Emergency Medicine | Admitting: Emergency Medicine

## 2015-01-26 ENCOUNTER — Encounter (HOSPITAL_COMMUNITY): Payer: Self-pay | Admitting: Emergency Medicine

## 2015-01-26 DIAGNOSIS — G8918 Other acute postprocedural pain: Secondary | ICD-10-CM | POA: Insufficient documentation

## 2015-01-26 DIAGNOSIS — Z792 Long term (current) use of antibiotics: Secondary | ICD-10-CM | POA: Insufficient documentation

## 2015-01-26 DIAGNOSIS — I252 Old myocardial infarction: Secondary | ICD-10-CM | POA: Insufficient documentation

## 2015-01-26 DIAGNOSIS — Z79899 Other long term (current) drug therapy: Secondary | ICD-10-CM | POA: Insufficient documentation

## 2015-01-26 DIAGNOSIS — M79672 Pain in left foot: Secondary | ICD-10-CM | POA: Insufficient documentation

## 2015-01-26 DIAGNOSIS — B379 Candidiasis, unspecified: Secondary | ICD-10-CM | POA: Insufficient documentation

## 2015-01-26 MED ORDER — FLUCONAZOLE 100 MG PO TABS: 100.0000 mg | ORAL_TABLET | Freq: Once | ORAL | Status: AC

## 2015-01-26 NOTE — ED Provider Notes (Signed)
CSN: [id number]     Arrival date & time 01/26/15  1221 History   By signing my name below, I, Jenna Beltran, attest that this documentation has been prepared under the direction and in the presence of Jenna Lower, NP. Electronically Signed: Charlean Beltran, ED Scribe 01/26/2015 at 2:02 PM.   Chief Complaint  Patient presents with  . Foot Pain   The history is provided by the patient. No language interpreter was used.   HPI Comments: Jenna Beltran is a 50 y.o. female who presents to the Emergency Department complaining of recurrent foot pain following a surgical procedure 2 weeks ago. Pt was cutting hair when a short piece of coarse hair was stuck in the middle toe of her left foot. Hair was removed and abx course was completed within the past 2 weeks and Sx improved over the weekend. However, left foot began to swell again today with pain radiating to the knee. Patient is ambulatory, but reports pain upon movement. Pt tried Vicodin with little relief and ibuoprofen with mild relief. Pt also reports a yeast infection due to the prior course of abx. Pt denies any N/V, fever.  Past Medical History  Diagnosis Date  . MI (myocardial infarction) [facility])    Past Surgical History  Procedure Laterality Date  . Gastric bypass    . Breast surgery    . Tummy tuck     No family history on file. Social History  Substance Use Topics  . Smoking status: Never Smoker   . Smokeless tobacco: None  . Alcohol Use: Yes   OB History    No data available     Review of Systems  Constitutional: Negative for fever.  Musculoskeletal: Positive for joint swelling.  All other systems reviewed and are negative.  Allergies  Review of patient's allergies indicates no known allergies.  Home Medications   Prior to Admission medications   Medication Sig Start Date End Date Taking? Authorizing Provider  albuterol (PROVENTIL HFA;VENTOLIN HFA) 108 (90 Base) MCG/ACT inhaler Inhale 1 puff into the lungs  daily as needed for wheezing or shortness of breath.    Historical Provider, MD  albuterol (PROVENTIL HFA;VENTOLIN HFA) 108 (90 Base) MCG/ACT inhaler Inhale 2 puffs into the lungs every 2 (two) hours as needed for wheezing or shortness of breath (cough). 01/02/15   Jenna Camprubi-Soms, PA-C  carvedilol (COREG) 6.25 MG tablet Take 1 tablet (6.25 mg total) by mouth 2 (two) times daily. 06/17/14   Jenna Stade, MD  cephALEXin (KEFLEX) 500 MG capsule Take 1 capsule (500 mg total) by mouth 4 (four) times daily. 01/14/15   Jenna Dredge, PA-C  furosemide (LASIX) 20 MG tablet TAKE ONE TABLET BY MOUTH ONCE DAILY Patient not taking: Reported on 01/02/2015 10/28/14   Jenna Stade, MD  furosemide (LASIX) 20 MG tablet TAKE ONE TABLET BY MOUTH ONCE DAILY 11/02/14   Jenna Stade, MD  HYDROcodone-acetaminophen (NORCO/VICODIN) 5-325 MG tablet Take 1 tablet by mouth every 4 (four) hours as needed for moderate pain or severe pain. 01/14/15   Jenna Dredge, PA-C  ibuprofen (ADVIL,MOTRIN) 800 MG tablet Take 1 tablet (800 mg total) by mouth every 8 (eight) hours as needed for mild pain or moderate pain. 01/14/15   Jenna Dredge, PA-C  lisinopril (PRINIVIL,ZESTRIL) 20 MG tablet Take 1 tablet (20 mg total) by mouth daily. 05/27/14   Jenna Stade, MD  medroxyPROGESTERone (PROVERA) 2.5 MG tablet Take 1 tablet (2.5 mg total) by mouth daily. Patient not taking: Reported  on 01/02/2015 05/27/14   Jenna Stade, MD  potassium chloride SA (K-DUR,KLOR-CON) 20 MEQ tablet Take 1 tablet (20 mEq total) by mouth 2 (two) times daily. Patient not taking: Reported on 05/21/2014 04/12/14   Jenna Laine, MD  Pseudoephedrine-APAP-DM (DAYQUIL PO) Take 2 capsules by mouth every 6 (six) hours as needed (cough and cold symptoms).    Historical Provider, MD   BP 168/94 mmHg  Pulse 66  Temp(Src) 98 F (36.7 C) (Oral)  Resp 18  Ht  (1.549 m)  Wt 190 lb (86.183 kg)  BMI 35.92 kg/m2  SpO2 95% Physical Exam  Constitutional: She is oriented to  person, place, and time. She appears well-developed and well-nourished. No distress.  HENT:  Head: Normocephalic and atraumatic.  Eyes: Conjunctivae and EOM are normal.  Neck: Neck supple. No tracheal deviation present.  Cardiovascular: Normal rate.   Pulmonary/Chest: Effort normal. No respiratory distress.  Musculoskeletal:  Good ROM. Ambulatory but Pain on movement. generalized edema. No localized swelling. No redness or warmth. Tender to the bottom of the third toe  Neurological: She is alert and oriented to person, place, and time.  Skin: Skin is warm and dry.  Psychiatric: She has a normal mood and affect. Her behavior is normal.  Nursing note and vitals reviewed.  ED Course  Procedures (including critical care time) DIAGNOSTIC STUDIES: Oxygen Saturation is 95% on RA, adequate by my interpretation.    COORDINATION OF CARE: 1:00 PM-Discussed treatment plan which includes DG left foot with pt at bedside and pt agreed to plan.   Labs Review Labs Reviewed - No data to display  Imaging Review Dg Foot Complete Left  01/26/2015  CLINICAL DATA:  Left foot pain and swelling for the past 2 weeks. No known injury. EXAM: LEFT FOOT - COMPLETE 3+ VIEW COMPARISON:  None. FINDINGS: Diffuse dorsal on distal soft tissue swelling. The underlying bones have normal appearances. Mild inferior calcaneal spur formation. IMPRESSION: Soft tissue swelling without underlying bony abnormality. Electronically Signed   By: Beckie Salts M.D.   On: 01/26/2015 13:52   I have personally reviewed and evaluated these images and lab results as part of my medical decision-making.   EKG Interpretation None      MDM   Final diagnoses:  Left foot pain  Candidiasis   No acute bony injury. No sign of cellulitis. Pt instructed to not wear flip flops. Pt given diflucan for yeast related to recent antibiotics  I personally performed the services described in this documentation, which was scribed in my presence.  The recorded information has been reviewed and is accurate.      Jenna Lower, NP 01/26/15 1418  Alvira Monday, MD 01/26/15 2322

## 2015-01-26 NOTE — Discharge Instructions (Signed)

## 2015-01-26 NOTE — ED Notes (Signed)
Seen and treated for infection to left foot, c/o continued pain, no fever or other complaints, A/O X4, ambulatory and in NAD

## 2015-02-02 ENCOUNTER — Telehealth: Payer: Self-pay | Admitting: Cardiovascular Disease

## 2015-03-31 NOTE — Progress Notes (Signed)
Cardiology Office Note   Date:  04/02/2015   ID:  Jenna Beltran, DOB 09-28-1965, MRN [id number]  PCP:  No PCP Per Patient  Cardiologist: Dr. Eden Emms  Chest pain    History of Present Illness: Jenna Beltran is a 50 y.o. female with a history of gastric bypass surgery, obesity, LBBB, chronic systolic CHF (EF 65-53%), HTN, non compliance and pulmonary nodules who presents to clinic for evaluation of chest pain.  She was seen in the ER on 04/12/14 for evaluation of uncontrolled hypertension as well as CHF in the setting of not taking her antihypertensives in a month. Close outpatient cardiology was recommended. She established care with Dr. Eden Emms on 05/21/14. She complained of dyspnea and intermittent chest pain. He ordered a nuclear stress test and 2-D echo as well as a chest CT to follow-up after previously abnormal chest x-ray. 2-D echo showed a moderately dilated left ventricle, mild LVH with severe LV systolic dysfunction with an EF of 25-30% with akinesis of the anteroseptal myocardium, G1 DD and mild LA dilation. Dr. Eden Emms stopped Norvasc and added Coreg to her lisinopril. He recommended that he follow-up with him and get set up for a left and right heart cath after meds were titrated. However, I don't see that this was ever done. Nuclear stress test was also never done. Chest CT showed peri-fissural pulmonary nodules with follow-up CT recommended in 12 months.  Today she presents to clinic for evaluation of chest pain. She was recently at the beach and felt like she was "filling up with fluid" despite 20mg  lasix daily. On Saturday, she had 10/10 squeezing chest pain associated with SOB, profuse diaphoresis, n/v while walking up from dinner. It lasted for ~4 hours. She feels fluid overloaded. She feels like she has swelling everywhere and has gained 10 lbs.  She reports orthopnea and PND. Currently she has some mild chest heaviness and SOB. We discussed outpatient diuresis vs inpatient with  heart cath early next week. She prefers outpatient.   Past Medical History  Diagnosis Date  . MI (myocardial infarction) [facility])     Past Surgical History  Procedure Laterality Date  . Gastric bypass    . Breast surgery    . Tummy tuck       Current Outpatient Prescriptions  Medication Sig Dispense Refill  . carvedilol (COREG) 6.25 MG tablet Take 1 tablet (6.25 mg total) by mouth 2 (two) times daily. 180 tablet 3  . lisinopril (PRINIVIL,ZESTRIL) 20 MG tablet Take 1 tablet (20 mg total) by mouth daily. 30 tablet 3  . furosemide (LASIX) 20 MG tablet TAKE 4 TABLETS BY MOUTH X'S 3 DAYS THEN TAKE 1 TABLET BY MOUTH DAILY 42 tablet 3  . potassium chloride (K-DUR) 10 MEQ tablet TAKE 4 TABLETS BY MOUTH X'S 3 DAYS THEN TAKE 1 TABLET BY MOUTH DAILY 42 tablet 3  . [DISCONTINUED] hydrochlorothiazide (HYDRODIURIL) 25 MG tablet Take 0.5 tablets (12.5 mg total) by mouth daily. 30 tablet 0   No current facility-administered medications for this visit.    Allergies:   Review of patient's allergies indicates no known allergies.    Social History:  The patient  reports that she has never smoked. She does not have any smokeless tobacco history on file. She reports that she drinks alcohol. She reports that she does not use illicit drugs.   Family History:  Father died in 50s of unknown causes. Mother died at 68 from a blockage in her colon. No HNT, HLD or DM  in her family that she knows of. She does not talk to her brother or know his history.    ROS:  Please see the history of present illness.   Otherwise, review of systems are positive for none.   All other systems are reviewed and negative.    PHYSICAL EXAM: VS:  BP 130/90 mmHg  Pulse 69  Ht 5\' 1"  (1.549 m)  Wt 202 lb 9.6 oz (91.899 kg)  BMI 38.30 kg/m2 , BMI Body mass index is 38.3 kg/(m^2). GEN: Well nourished, well developed, in no acute distress. obese HEENT: normal Neck: + JVD, carotid bruits, or masses Cardiac: RRR; no murmurs, rubs,  or gallops,no edema  Respiratory:  clear to auscultation bilaterally, decreased breath sounds at bases. GI: soft, nontender, nondistended, + BS MS: no deformity or atrophy Skin: warm and dry, no rash Neuro:  Strength and sensation are intact Psych: euthymic mood, full affect   EKG:  EKG is ordered today. The ekg ordered today demonstrates LBBB, NSR HR 69   Recent Labs: 01/02/2015: B Natriuretic Peptide 624.1*; BUN 14; Creatinine, Ser 0.59; Hemoglobin 15.2*; Platelets 190; Potassium 3.8; Sodium 138    Lipid Panel No results found for: CHOL, TRIG, HDL, CHOLHDL, VLDL, LDLCALC, LDLDIRECT    Wt Readings from Last 3 Encounters:  04/02/15 202 lb 9.6 oz (91.899 kg)  01/26/15 190 lb (86.183 kg)  01/14/15 195 lb (88.451 kg)      Other studies Reviewed: Additional studies/ records that were reviewed today include: 2D ECHO. Review of the above records demonstrates:   2D ECHO: 06/15/2014 LV EF: 25% - 30% Study Conclusions - Left ventricle: The cavity size was moderately dilated. Wall  thickness was increased in a pattern of mild LVH. Systolic  function was severely reduced. The estimated ejection fraction  was in the range of 25% to 30%. There is akinesis of the  anteroseptal myocardium. Doppler parameters are consistent with  abnormal left ventricular relaxation (grade 1 diastolic  dysfunction). Doppler parameters are consistent with high  ventricular filling pressure. - Ventricular septum: Septal motion showed abnormal function and  dyssynergy. - Left atrium: The atrium was mildly dilated  Chest CT 05/21/15 IMPRESSION: 1. Pulmonary artery enlargement suggests pulmonary arterial hypertension. The right hilar soft tissue fullness was likely related to an enlarged right pulmonary artery. No hilar adenopathy or mass. 2. Mild cardiomegaly. 3. Perifissural pulmonary nodules may represent subpleural lymph nodes. The largest measures 6 mm. If the patient is at high risk  for bronchogenic carcinoma, follow-up chest CT at 6-12 months is recommended. If the patient is at low risk for bronchogenic carcinoma, follow-up chest CT at 12 months is recommended.   ASSESSMENT AND PLAN:  Jenna Beltran is a 50 y.o. female with a history of gastric bypass surgery, obesity, LBBB, chronic systolic CHF (EF 16-10%), HTN, non compliance and pulmonary nodules who presents to clinic for evaluation of chest pain.  Chronic systolic CHF: she appears mildly volume overloaded/ -- 2-D echo showed a moderately dilated left ventricle, mild LVH with severe LV systolic dysfunction with an EF of 25-30% with akinesis of the anteroseptal myocardium, G1DD and mild LA dilation.  -- Continue Coreg and lisinopril. I will increase her Lasix to 80 mg daily/KDur 3 days. On Monday she can resume 20mg  Lasix daily/Kdur daily.  -- Will arrange for [facility] with reduced EF with WMA and chest pain. This will be done Tuesday morning with Dr. Eden Emms. I will get precath labs today. I will also recheck her  kidney function on Monday after diuresis over the weekend.  Chest pain: Previously did not show up for her nuclear stress test. As above will set her up for coronary angiography.   HTN: Continue Coreg 6.25 twice a day and lisinopril 20 mg daily  Pulmonary nodules: chest CT (05/2014) showed peri-fissural pulmonary nodules with follow-up CT recommended in 12 months (since she is a nonsmoker at low risk for bronchogenic carcinoma.) This will be due in 06/2015.  Noncompliance: She has a history of not taking her medicines and has been lost to follow-up when clear follow-up was arranged. I have stressed the importance of making her appointments and going to her tests and taking her medicines.   Current medicines are reviewed at length with the patient today.  The patient does not have concerns regarding medicines.  The following changes have been made:   I will increase her Lasix to 80 mg daily/KDur  3 days.  Labs/ tests ordered today include:   Orders Placed This Encounter  Procedures  . CBC w/Diff  . Basic Metabolic Panel (BMET)  . INR/PT  . Basic Metabolic Panel (BMET)  . EKG 12-Lead    Disposition:   FU with Dr. Eden Emms after her heart cath   Signed, Allena Katz  04/02/2015 9:22 AM    [facility] [address] [place], [place], [place]  [address] Phone: [phone]; Fax: [phone]

## 2015-04-01 ENCOUNTER — Ambulatory Visit: Payer: Self-pay | Admitting: Cardiovascular Disease

## 2015-04-02 ENCOUNTER — Encounter: Payer: Self-pay | Admitting: *Deleted

## 2015-04-02 ENCOUNTER — Ambulatory Visit (INDEPENDENT_AMBULATORY_CARE_PROVIDER_SITE_OTHER): Payer: BLUE CROSS/BLUE SHIELD | Admitting: Physician Assistant

## 2015-04-02 ENCOUNTER — Encounter: Payer: Self-pay | Admitting: Physician Assistant

## 2015-04-02 VITALS — BP 130/90 | HR 69 | Ht 61.0 in | Wt 202.6 lb

## 2015-04-02 DIAGNOSIS — R0602 Shortness of breath: Secondary | ICD-10-CM | POA: Diagnosis not present

## 2015-04-02 DIAGNOSIS — R0789 Other chest pain: Secondary | ICD-10-CM

## 2015-04-02 DIAGNOSIS — I429 Cardiomyopathy, unspecified: Secondary | ICD-10-CM

## 2015-04-02 LAB — CBC WITH DIFFERENTIAL/PLATELET
Basophils Absolute: 0.1 10*3/uL (ref 0.0–0.1)
Basophils Relative: 1 % (ref 0–1)
Eosinophils Absolute: 0.2 10*3/uL (ref 0.0–0.7)
Eosinophils Relative: 3 % (ref 0–5)
HEMATOCRIT: 43.7 % (ref 36.0–46.0)
HEMOGLOBIN: 14.9 g/dL (ref 12.0–15.0)
LYMPHS ABS: 1.8 10*3/uL (ref 0.7–4.0)
Lymphocytes Relative: 33 % (ref 12–46)
MCH: 31 pg (ref 26.0–34.0)
MCHC: 34.1 g/dL (ref 30.0–36.0)
MCV: 90.9 fL (ref 78.0–100.0)
MONO ABS: 0.6 10*3/uL (ref 0.1–1.0)
MONOS PCT: 11 % (ref 3–12)
MPV: 10 fL (ref 8.6–12.4)
NEUTROS ABS: 2.9 10*3/uL (ref 1.7–7.7)
NEUTROS PCT: 52 % (ref 43–77)
Platelets: 231 10*3/uL (ref 150–400)
RBC: 4.81 MIL/uL (ref 3.87–5.11)
RDW: 13.5 % (ref 11.5–15.5)
WBC: 5.5 10*3/uL (ref 4.0–10.5)

## 2015-04-02 LAB — BASIC METABOLIC PANEL
BUN: 17 mg/dL (ref 7–25)
CALCIUM: 9.2 mg/dL (ref 8.6–10.2)
CO2: 27 mmol/L (ref 20–31)
CREATININE: 0.62 mg/dL (ref 0.50–1.10)
Chloride: 102 mmol/L (ref 98–110)
GLUCOSE: 91 mg/dL (ref 65–99)
Potassium: 3.7 mmol/L (ref 3.5–5.3)
Sodium: 141 mmol/L (ref 135–146)

## 2015-04-02 LAB — PROTIME-INR
INR: 0.95 (ref <1.50)
Prothrombin Time: 12.8 seconds (ref 11.6–15.2)

## 2015-04-02 MED ORDER — FUROSEMIDE 20 MG PO TABS: ORAL_TABLET | ORAL | Status: DC

## 2015-04-02 MED ORDER — POTASSIUM CHLORIDE ER 10 MEQ PO TBCR: EXTENDED_RELEASE_TABLET | ORAL | Status: DC

## 2015-04-02 MED ORDER — LISINOPRIL 20 MG PO TABS: 20.0000 mg | ORAL_TABLET | Freq: Every day | ORAL | Status: DC

## 2015-04-02 NOTE — Patient Instructions (Signed)
Medication Instructions:  Your physician has recommended you make the following change in your medication:  1.  START Lasix 20 mg taking 4 tablets by mouth X's 3 days then go to 1 tablet daily  2.  START Potassium 10 meq taking 4 tablets by mouth X's 3 days then go to 1 tablet daily   Labwork: TODAY:  BMET, CBC W/DIFF, & PT/INR 04/05/15:  BMET  Testing/Procedures: Your physician has requested that you have a cardiac catheterization. Cardiac catheterization is used to diagnose and/or treat various heart conditions. Doctors may recommend this procedure for a number of different reasons. The most common reason is to evaluate chest pain. Chest pain can be a symptom of coronary artery disease (CAD), and cardiac catheterization can show whether plaque is narrowing or blocking your heart's arteries. This procedure is also used to evaluate the valves, as well as measure the blood flow and oxygen levels in different parts of your heart. For further information please visit [email or web address] Please follow instruction sheet, as given.   Follow-Up: Your physician recommends that you schedule a follow-up appointment in: WILL BE DISCUSSED AFTER YOUR PROCEDURE   Any Other Special Instructions Will Be Listed Below (If Applicable).     If you need a refill on your cardiac medications before your next appointment, please call your pharmacy.

## 2015-04-03 DIAGNOSIS — Z9889 Other specified postprocedural states: Secondary | ICD-10-CM

## 2015-04-03 HISTORY — DX: Other specified postprocedural states: Z98.890

## 2015-04-05 ENCOUNTER — Other Ambulatory Visit: Payer: Self-pay | Admitting: Cardiovascular Disease

## 2015-04-05 ENCOUNTER — Other Ambulatory Visit (INDEPENDENT_AMBULATORY_CARE_PROVIDER_SITE_OTHER): Payer: BLUE CROSS/BLUE SHIELD | Admitting: *Deleted

## 2015-04-05 DIAGNOSIS — R0602 Shortness of breath: Secondary | ICD-10-CM

## 2015-04-05 DIAGNOSIS — R0789 Other chest pain: Secondary | ICD-10-CM | POA: Diagnosis not present

## 2015-04-05 LAB — BASIC METABOLIC PANEL
BUN: 16 mg/dL (ref 7–25)
CALCIUM: 9.6 mg/dL (ref 8.6–10.2)
CHLORIDE: 104 mmol/L (ref 98–110)
CO2: 25 mmol/L (ref 20–31)
CREATININE: 0.62 mg/dL (ref 0.50–1.10)
GLUCOSE: 97 mg/dL (ref 65–99)
Potassium: 3.9 mmol/L (ref 3.5–5.3)
SODIUM: 140 mmol/L (ref 135–146)

## 2015-04-06 ENCOUNTER — Encounter (HOSPITAL_COMMUNITY): Payer: Self-pay | Admitting: Cardiovascular Disease

## 2015-04-06 ENCOUNTER — Ambulatory Visit (HOSPITAL_COMMUNITY)
Admission: RE | Admit: 2015-04-06 | Discharge: 2015-04-06 | Disposition: A | Discharge status: Home / Self Care | Payer: BLUE CROSS/BLUE SHIELD | Source: Ambulatory Visit | Attending: Cardiovascular Disease | Admitting: Cardiovascular Disease

## 2015-04-06 ENCOUNTER — Encounter (HOSPITAL_COMMUNITY)
Admission: RE | Disposition: A | Discharge status: Home / Self Care | Payer: Self-pay | Source: Ambulatory Visit | Attending: Cardiovascular Disease

## 2015-04-06 DIAGNOSIS — I447 Left bundle-branch block, unspecified: Secondary | ICD-10-CM | POA: Insufficient documentation

## 2015-04-06 DIAGNOSIS — E669 Obesity, unspecified: Secondary | ICD-10-CM | POA: Diagnosis not present

## 2015-04-06 DIAGNOSIS — I420 Dilated cardiomyopathy: Secondary | ICD-10-CM | POA: Diagnosis present

## 2015-04-06 DIAGNOSIS — I252 Old myocardial infarction: Secondary | ICD-10-CM | POA: Diagnosis not present

## 2015-04-06 DIAGNOSIS — I5022 Chronic systolic (congestive) heart failure: Secondary | ICD-10-CM | POA: Diagnosis not present

## 2015-04-06 DIAGNOSIS — Z9884 Bariatric surgery status: Secondary | ICD-10-CM | POA: Insufficient documentation

## 2015-04-06 DIAGNOSIS — I509 Heart failure, unspecified: Secondary | ICD-10-CM | POA: Diagnosis not present

## 2015-04-06 DIAGNOSIS — Z9119 Patient's noncompliance with other medical treatment and regimen: Secondary | ICD-10-CM | POA: Diagnosis not present

## 2015-04-06 DIAGNOSIS — R079 Chest pain, unspecified: Secondary | ICD-10-CM | POA: Diagnosis not present

## 2015-04-06 DIAGNOSIS — Z6838 Body mass index (BMI) 38.0-38.9, adult: Secondary | ICD-10-CM | POA: Diagnosis not present

## 2015-04-06 DIAGNOSIS — I110 Hypertensive heart disease with heart failure: Secondary | ICD-10-CM | POA: Insufficient documentation

## 2015-04-06 HISTORY — PX: CARDIAC CATHETERIZATION: SHX172

## 2015-04-06 LAB — POCT ACTIVATED CLOTTING TIME: ACTIVATED CLOTTING TIME: 204 s

## 2015-04-06 LAB — POCT I-STAT 3, ART BLOOD GAS (G3+)
ACID-BASE DEFICIT: 6 mmol/L — AB (ref 0.0–2.0)
Bicarbonate: 21.6 mEq/L (ref 20.0–24.0)
O2 Saturation: 89 %
PCO2 ART: 51 mmHg — AB (ref 35.0–45.0)
PH ART: 7.236 — AB (ref 7.350–7.450)
PO2 ART: 66 mmHg — AB (ref 80.0–100.0)
TCO2: 23 mmol/L (ref 0–100)

## 2015-04-06 LAB — POCT I-STAT 3, VENOUS BLOOD GAS (G3P V)
Bicarbonate: 25.9 mEq/L — ABNORMAL HIGH (ref 20.0–24.0)
O2 Saturation: 71 %
PH VEN: 7.358 — AB (ref 7.250–7.300)
TCO2: 27 mmol/L (ref 0–100)
pCO2, Ven: 46.1 mmHg (ref 45.0–50.0)
pO2, Ven: 39 mmHg (ref 31.0–45.0)

## 2015-04-06 SURGERY — RIGHT/LEFT HEART CATH AND CORONARY ANGIOGRAPHY
Anesthesia: LOCAL

## 2015-04-06 MED ORDER — OXYCODONE-ACETAMINOPHEN 5-325 MG PO TABS
1.0000 | ORAL_TABLET | ORAL | Status: DC | PRN
  Administered 2015-04-06: 1 via ORAL

## 2015-04-06 MED ORDER — HYDRALAZINE HCL 20 MG/ML IJ SOLN
INTRAMUSCULAR | Status: AC
  Filled 2015-04-06: qty 1

## 2015-04-06 MED ORDER — HEPARIN SODIUM (PORCINE) 1000 UNIT/ML IJ SOLN
INTRAMUSCULAR | Status: DC | PRN
  Administered 2015-04-06: 5000 [IU] via INTRAVENOUS

## 2015-04-06 MED ORDER — SODIUM CHLORIDE 0.9% FLUSH: 3.0000 mL | INTRAVENOUS | Status: DC | PRN

## 2015-04-06 MED ORDER — IOPAMIDOL (ISOVUE-370) INJECTION 76%
INTRAVENOUS | Status: DC | PRN
Start: 2015-04-06 — End: 2015-04-06
  Administered 2015-04-06: 40 mL via INTRA_ARTERIAL

## 2015-04-06 MED ORDER — SODIUM CHLORIDE 0.9 % IV SOLN
INTRAVENOUS | Status: DC
  Administered 2015-04-06: 07:00:00 via INTRAVENOUS

## 2015-04-06 MED ORDER — ASPIRIN 81 MG PO CHEW
CHEWABLE_TABLET | ORAL | Status: AC
  Administered 2015-04-06: 81 mg via ORAL
  Filled 2015-04-06: qty 1

## 2015-04-06 MED ORDER — LIDOCAINE HCL (PF) 1 % IJ SOLN
INTRAMUSCULAR | Status: DC | PRN
  Administered 2015-04-06 (×2): 2 mL via INTRADERMAL

## 2015-04-06 MED ORDER — LIDOCAINE HCL (PF) 1 % IJ SOLN
INTRAMUSCULAR | Status: AC
  Filled 2015-04-06: qty 30

## 2015-04-06 MED ORDER — SODIUM CHLORIDE 0.9 % IV SOLN: 250.0000 mL | INTRAVENOUS | Status: DC | PRN

## 2015-04-06 MED ORDER — HEPARIN SODIUM (PORCINE) 1000 UNIT/ML IJ SOLN
INTRAMUSCULAR | Status: AC
  Filled 2015-04-06: qty 1

## 2015-04-06 MED ORDER — HYDRALAZINE HCL 20 MG/ML IJ SOLN
10.0000 mg | Freq: Once | INTRAMUSCULAR | Status: AC
  Administered 2015-04-06: 10 mg via INTRAVENOUS

## 2015-04-06 MED ORDER — ASPIRIN 81 MG PO CHEW
81.0000 mg | CHEWABLE_TABLET | ORAL | Status: AC
  Administered 2015-04-06: 81 mg via ORAL

## 2015-04-06 MED ORDER — FENTANYL CITRATE (PF) 100 MCG/2ML IJ SOLN
INTRAMUSCULAR | Status: DC | PRN
  Administered 2015-04-06: 25 ug via INTRAVENOUS

## 2015-04-06 MED ORDER — OXYCODONE-ACETAMINOPHEN 5-325 MG PO TABS
ORAL_TABLET | ORAL | Status: AC
  Administered 2015-04-06: 1 via ORAL
  Filled 2015-04-06: qty 1

## 2015-04-06 MED ORDER — DIAZEPAM 2 MG PO TABS
2.0000 mg | ORAL_TABLET | ORAL | Status: DC | PRN
Start: 2015-04-06 — End: 2015-04-06

## 2015-04-06 MED ORDER — SODIUM CHLORIDE 0.9% FLUSH: 3.0000 mL | Freq: Two times a day (BID) | INTRAVENOUS | Status: DC

## 2015-04-06 MED ORDER — FENTANYL CITRATE (PF) 100 MCG/2ML IJ SOLN
INTRAMUSCULAR | Status: AC
  Filled 2015-04-06: qty 2

## 2015-04-06 MED ORDER — VERAPAMIL HCL 2.5 MG/ML IV SOLN
INTRAVENOUS | Status: AC
  Filled 2015-04-06: qty 2

## 2015-04-06 MED ORDER — MIDAZOLAM HCL 2 MG/2ML IJ SOLN
INTRAMUSCULAR | Status: DC | PRN
  Administered 2015-04-06: 2 mg via INTRAVENOUS

## 2015-04-06 MED ORDER — HEPARIN (PORCINE) IN NACL 2-0.9 UNIT/ML-% IJ SOLN
INTRAMUSCULAR | Status: DC | PRN
  Administered 2015-04-06: 1000 mL

## 2015-04-06 MED ORDER — MIDAZOLAM HCL 2 MG/2ML IJ SOLN
INTRAMUSCULAR | Status: AC
  Filled 2015-04-06: qty 2

## 2015-04-06 MED ORDER — IOPAMIDOL (ISOVUE-370) INJECTION 76%
INTRAVENOUS | Status: AC
  Filled 2015-04-06: qty 100

## 2015-04-06 MED ORDER — VERAPAMIL HCL 2.5 MG/ML IV SOLN
INTRAVENOUS | Status: DC | PRN
  Administered 2015-04-06: 10 mL via INTRA_ARTERIAL

## 2015-04-06 MED ORDER — HEPARIN (PORCINE) IN NACL 2-0.9 UNIT/ML-% IJ SOLN
INTRAMUSCULAR | Status: AC
  Filled 2015-04-06: qty 1000

## 2015-04-06 SURGICAL SUPPLY — 12 items
CATH BALLN WEDGE 5F 110CM (CATHETERS) ×2 IMPLANT
CATH INFINITI 5 FR JL3.5 (CATHETERS) ×2 IMPLANT
CATH INFINITI 5FR ANG PIGTAIL (CATHETERS) ×2 IMPLANT
CATH INFINITI JR4 5F (CATHETERS) ×2 IMPLANT
DEVICE RAD COMP TR BAND LRG (VASCULAR PRODUCTS) ×2 IMPLANT
GLIDESHEATH SLEND SS 6F .021 (SHEATH) ×2 IMPLANT
KIT HEART LEFT (KITS) ×2 IMPLANT
PACK CARDIAC CATHETERIZATION (CUSTOM PROCEDURE TRAY) ×2 IMPLANT
SHEATH FAST CATH BRACH 5F 5CM (SHEATH) ×2 IMPLANT
TRANSDUCER W/STOPCOCK (MISCELLANEOUS) ×2 IMPLANT
TUBING CIL FLEX 10 FLL-RA (TUBING) ×2 IMPLANT
WIRE SAFE-T 1.5MM-J .035X260CM (WIRE) ×2 IMPLANT

## 2015-04-06 NOTE — Interval H&P Note (Signed)
History and Physical Interval Note:  04/06/2015 8:12 AM  Jenna Beltran  has presented today for surgery, with the diagnosis of cp, sob, cardiomyopathy  The various methods of treatment have been discussed with the patient and family. After consideration of risks, benefits and other options for treatment, the patient has consented to  Procedure(s): Right/Left Heart Cath and Coronary Angiography (N/A) as a surgical intervention .  The patient's history has been reviewed, patient examined, no change in status, stable for surgery.  I have reviewed the patient's chart and labs.  Questions were answered to the patient's satisfaction.     Charlton Haws

## 2015-04-06 NOTE — Progress Notes (Signed)
Attempted to aspirate 2cc from TR band but rebled. 13cc

## 2015-04-06 NOTE — Progress Notes (Signed)
Site area: rt ac venous sheath Site Prior to Removal:  Level 0 Pressure Applied For:  15 minutes Manual:   yes Patient Status During Pull:  stable Post Pull Site:  Level  0 Post Pull Instructions Given:  yes Post Pull Pulses Present: yes Dressing Applied:  Small tegaderm Bedrest begins @  1015 Comments:

## 2015-04-06 NOTE — Discharge Instructions (Signed)
Angiogram, Care After °These instructions give you information about caring for yourself after your procedure. Your doctor may also give you more specific instructions. Call your doctor if you have any problems or questions after your procedure.  °HOME CARE °· Take medicines only as told by your doctor. °· Follow your doctor's instructions about: °¨ Care of the area where the tube was inserted. °¨ Bandage (dressing) changes and removal. °· You may shower 24-48 hours after the procedure or as told by your doctor. °· Do not take baths, swim, or use a hot tub until your doctor approves. °· Every day, check the area where the tube was inserted. Watch for: °¨ Redness, swelling, or pain. °¨ Fluid, blood, or pus. °· Do not apply powder or lotion to the site. °· Do not lift anything that is heavier than 10 lb (4.5 kg) for 5 days or as told by your doctor. °· Ask your doctor when you can: °¨ Return to work or school. °¨ Do physical activities or play sports. °¨ Have sex. °· Do not drive or operate heavy machinery for 24 hours or as told by your doctor. °· Have someone with you for the first 24 hours after the procedure. °· Keep all follow-up visits as told by your doctor. This is important. °GET HELP IF: °· You have a fever.   °· You have chills.   °· You have more bleeding from the area where the tube was inserted. Hold pressure on the area. °· You have redness, swelling, or pain in the area where the tube was inserted. °· You have fluid or pus coming from the area. °GET HELP RIGHT AWAY IF:  °· You have a lot of pain in the area where the tube was inserted. °· The area where the tube was inserted is bleeding, and the bleeding does not stop after 30 minutes of holding steady pressure on the area. °· The area near or just beyond the insertion site becomes pale, cool, tingly, or numb. °  °This information is not intended to replace advice given to you by your health care provider. Make sure you discuss any questions you have  with your health care provider. °  °Document Released: 03/17/2008 Document Revised: 01/09/2014 Document Reviewed: 05/22/2012 °Elsevier Interactive Patient Education ©2016 [employer] ° °Radial Site Care °Refer to this sheet in the next few weeks. These instructions provide you with information about caring for yourself after your procedure. Your health care provider may also give you more specific instructions. Your treatment has been planned according to current medical practices, but problems sometimes occur. Call your health care provider if you have any problems or questions after your procedure. °WHAT TO EXPECT AFTER THE PROCEDURE °After your procedure, it is typical to have the following: °· Bruising at the radial site that usually fades within 1-2 weeks. °· Blood collecting in the tissue (hematoma) that may be painful to the touch. It should usually decrease in size and tenderness within 1-2 weeks. °HOME CARE INSTRUCTIONS °· Take medicines only as directed by your health care provider. °· You may shower 24-48 hours after the procedure or as directed by your health care provider. Remove the bandage (dressing) and gently wash the site with plain soap and water. Pat the area dry with a clean towel. Do not rub the site, because this may cause bleeding. °· Do not take baths, swim, or use a hot tub until your health care provider approves. °· Check your insertion site every day for redness,   swelling, or drainage. °· Do not apply powder or lotion to the site. °· Do not flex or bend the affected arm for 24 hours or as directed by your health care provider. °· Do not push or pull heavy objects with the affected arm for 24 hours or as directed by your health care provider. °· Do not lift over 10 lb (4.5 kg) for 5 days after your procedure or as directed by your health care provider. °· Ask your health care provider when it is okay to: °¨ Return to work or school. °¨ Resume usual physical activities or  sports. °¨ Resume sexual activity. °· Do not drive home if you are discharged the same day as the procedure. Have someone else drive you. °· You may drive 24 hours after the procedure unless otherwise instructed by your health care provider. °· Do not operate machinery or power tools for 24 hours after the procedure. °· If your procedure was done as an outpatient procedure, which means that you went home the same day as your procedure, a responsible adult should be with you for the first 24 hours after you arrive home. °· Keep all follow-up visits as directed by your health care provider. This is important. °SEEK MEDICAL CARE IF: °· You have a fever. °· You have chills. °· You have increased bleeding from the radial site. Hold pressure on the site. °SEEK IMMEDIATE MEDICAL CARE IF: °· You have unusual pain at the radial site. °· You have redness, warmth, or swelling at the radial site. °· You have drainage (other than a small amount of blood on the dressing) from the radial site. °· The radial site is bleeding, and the bleeding does not stop after 30 minutes of holding steady pressure on the site. °· Your arm or hand becomes pale, cool, tingly, or numb. °  °This information is not intended to replace advice given to you by your health care provider. Make sure you discuss any questions you have with your health care provider. °  °Document Released: 01/21/2010 Document Revised: 01/09/2014 Document Reviewed: 07/07/2013 °Elsevier Interactive Patient Education ©2016 [employer] ° °

## 2015-04-06 NOTE — H&P (View-Only) (Signed)
Cardiology Office Note   Date:  04/02/2015   ID:  Jenna Beltran, DOB 09-28-1965, MRN [id number]  PCP:  No PCP Per Patient  Cardiologist: Dr. Eden Emms  Chest pain    History of Present Illness: Jenna Beltran is a 50 y.o. female with a history of gastric bypass surgery, obesity, LBBB, chronic systolic CHF (EF 65-53%), HTN, non compliance and pulmonary nodules who presents to clinic for evaluation of chest pain.  She was seen in the ER on 04/12/14 for evaluation of uncontrolled hypertension as well as CHF in the setting of not taking her antihypertensives in a month. Close outpatient cardiology was recommended. She established care with Dr. Eden Emms on 05/21/14. She complained of dyspnea and intermittent chest pain. He ordered a nuclear stress test and 2-D echo as well as a chest CT to follow-up after previously abnormal chest x-ray. 2-D echo showed a moderately dilated left ventricle, mild LVH with severe LV systolic dysfunction with an EF of 25-30% with akinesis of the anteroseptal myocardium, G1 DD and mild LA dilation. Dr. Eden Emms stopped Norvasc and added Coreg to her lisinopril. He recommended that he follow-up with him and get set up for a left and right heart cath after meds were titrated. However, I don't see that this was ever done. Nuclear stress test was also never done. Chest CT showed peri-fissural pulmonary nodules with follow-up CT recommended in 12 months.  Today she presents to clinic for evaluation of chest pain. She was recently at the beach and felt like she was "filling up with fluid" despite 20mg  lasix daily. On Saturday, she had 10/10 squeezing chest pain associated with SOB, profuse diaphoresis, n/v while walking up from dinner. It lasted for ~4 hours. She feels fluid overloaded. She feels like she has swelling everywhere and has gained 10 lbs.  She reports orthopnea and PND. Currently she has some mild chest heaviness and SOB. We discussed outpatient diuresis vs inpatient with  heart cath early next week. She prefers outpatient.   Past Medical History  Diagnosis Date  . MI (myocardial infarction) [facility])     Past Surgical History  Procedure Laterality Date  . Gastric bypass    . Breast surgery    . Tummy tuck       Current Outpatient Prescriptions  Medication Sig Dispense Refill  . carvedilol (COREG) 6.25 MG tablet Take 1 tablet (6.25 mg total) by mouth 2 (two) times daily. 180 tablet 3  . lisinopril (PRINIVIL,ZESTRIL) 20 MG tablet Take 1 tablet (20 mg total) by mouth daily. 30 tablet 3  . furosemide (LASIX) 20 MG tablet TAKE 4 TABLETS BY MOUTH X'S 3 DAYS THEN TAKE 1 TABLET BY MOUTH DAILY 42 tablet 3  . potassium chloride (K-DUR) 10 MEQ tablet TAKE 4 TABLETS BY MOUTH X'S 3 DAYS THEN TAKE 1 TABLET BY MOUTH DAILY 42 tablet 3  . [DISCONTINUED] hydrochlorothiazide (HYDRODIURIL) 25 MG tablet Take 0.5 tablets (12.5 mg total) by mouth daily. 30 tablet 0   No current facility-administered medications for this visit.    Allergies:   Review of patient's allergies indicates no known allergies.    Social History:  The patient  reports that she has never smoked. She does not have any smokeless tobacco history on file. She reports that she drinks alcohol. She reports that she does not use illicit drugs.   Family History:  Father died in 50s of unknown causes. Mother died at 68 from a blockage in her colon. No HNT, HLD or DM  in her family that she knows of. She does not talk to her brother or know his history.    ROS:  Please see the history of present illness.   Otherwise, review of systems are positive for none.   All other systems are reviewed and negative.    PHYSICAL EXAM: VS:  BP 130/90 mmHg  Pulse 69  Ht 5\' 1"  (1.549 m)  Wt 202 lb 9.6 oz (91.899 kg)  BMI 38.30 kg/m2 , BMI Body mass index is 38.3 kg/(m^2). GEN: Well nourished, well developed, in no acute distress. obese HEENT: normal Neck: + JVD, carotid bruits, or masses Cardiac: RRR; no murmurs, rubs,  or gallops,no edema  Respiratory:  clear to auscultation bilaterally, decreased breath sounds at bases. GI: soft, nontender, nondistended, + BS MS: no deformity or atrophy Skin: warm and dry, no rash Neuro:  Strength and sensation are intact Psych: euthymic mood, full affect   EKG:  EKG is ordered today. The ekg ordered today demonstrates LBBB, NSR HR 69   Recent Labs: 01/02/2015: B Natriuretic Peptide 624.1*; BUN 14; Creatinine, Ser 0.59; Hemoglobin 15.2*; Platelets 190; Potassium 3.8; Sodium 138    Lipid Panel No results found for: CHOL, TRIG, HDL, CHOLHDL, VLDL, LDLCALC, LDLDIRECT    Wt Readings from Last 3 Encounters:  04/02/15 202 lb 9.6 oz (91.899 kg)  01/26/15 190 lb (86.183 kg)  01/14/15 195 lb (88.451 kg)      Other studies Reviewed: Additional studies/ records that were reviewed today include: 2D ECHO. Review of the above records demonstrates:   2D ECHO: 06/15/2014 LV EF: 25% - 30% Study Conclusions - Left ventricle: The cavity size was moderately dilated. Wall  thickness was increased in a pattern of mild LVH. Systolic  function was severely reduced. The estimated ejection fraction  was in the range of 25% to 30%. There is akinesis of the  anteroseptal myocardium. Doppler parameters are consistent with  abnormal left ventricular relaxation (grade 1 diastolic  dysfunction). Doppler parameters are consistent with high  ventricular filling pressure. - Ventricular septum: Septal motion showed abnormal function and  dyssynergy. - Left atrium: The atrium was mildly dilated  Chest CT 05/21/15 IMPRESSION: 1. Pulmonary artery enlargement suggests pulmonary arterial hypertension. The right hilar soft tissue fullness was likely related to an enlarged right pulmonary artery. No hilar adenopathy or mass. 2. Mild cardiomegaly. 3. Perifissural pulmonary nodules may represent subpleural lymph nodes. The largest measures 6 mm. If the patient is at high risk  for bronchogenic carcinoma, follow-up chest CT at 6-12 months is recommended. If the patient is at low risk for bronchogenic carcinoma, follow-up chest CT at 12 months is recommended.   ASSESSMENT AND PLAN:  Galit Urich is a 50 y.o. female with a history of gastric bypass surgery, obesity, LBBB, chronic systolic CHF (EF 16-10%), HTN, non compliance and pulmonary nodules who presents to clinic for evaluation of chest pain.  Chronic systolic CHF: she appears mildly volume overloaded/ -- 2-D echo showed a moderately dilated left ventricle, mild LVH with severe LV systolic dysfunction with an EF of 25-30% with akinesis of the anteroseptal myocardium, G1DD and mild LA dilation.  -- Continue Coreg and lisinopril. I will increase her Lasix to 80 mg daily/KDur 3 days. On Monday she can resume 20mg  Lasix daily/Kdur daily.  -- Will arrange for [facility] with reduced EF with WMA and chest pain. This will be done Tuesday morning with Dr. Eden Emms. I will get precath labs today. I will also recheck her  kidney function on Monday after diuresis over the weekend.  Chest pain: Previously did not show up for her nuclear stress test. As above will set her up for coronary angiography.   HTN: Continue Coreg 6.25 twice a day and lisinopril 20 mg daily  Pulmonary nodules: chest CT (05/2014) showed peri-fissural pulmonary nodules with follow-up CT recommended in 12 months (since she is a nonsmoker at low risk for bronchogenic carcinoma.) This will be due in 06/2015.  Noncompliance: She has a history of not taking her medicines and has been lost to follow-up when clear follow-up was arranged. I have stressed the importance of making her appointments and going to her tests and taking her medicines.   Current medicines are reviewed at length with the patient today.  The patient does not have concerns regarding medicines.  The following changes have been made:   I will increase her Lasix to 80 mg daily/KDur  3 days.  Labs/ tests ordered today include:   Orders Placed This Encounter  Procedures  . CBC w/Diff  . Basic Metabolic Panel (BMET)  . INR/PT  . Basic Metabolic Panel (BMET)  . EKG 12-Lead    Disposition:   FU with Dr. Eden Emms after her heart cath   Signed, Allena Katz  04/02/2015 9:22 AM    [facility] [address] [place], [place], [place]  [address] Phone: [phone]; Fax: [phone]

## 2015-04-06 NOTE — CV Procedure (Signed)
See note in Epic 

## 2015-04-07 ENCOUNTER — Telehealth: Payer: Self-pay | Admitting: Cardiovascular Disease

## 2015-04-07 NOTE — Telephone Encounter (Signed)
Should not need anything stronger than tylenol, asa or Motrin can come in for [place] of wrist if pain is that bad Can also use ice

## 2015-04-07 NOTE — Telephone Encounter (Signed)
Follow Up:; ° ° °Returning your call. °

## 2015-04-07 NOTE — Telephone Encounter (Signed)
Called patient back. Informed her of Dr. Fabio Bering message. Patient states she would  try tylenol. Patient refused to come in for [place] of wrist.

## 2015-04-07 NOTE — Telephone Encounter (Signed)
Follow Up:   Returning call from yesterday,she is not sure who called.

## 2015-04-07 NOTE — Telephone Encounter (Signed)
Called patient back about her lab work. Gave patient her lab results. While on the phone patient complained of pain in her arm by her wrist  where she had her heart cath. Patient stated she only got a couple hours of sleep due to the pain in her arm. Suggested patient to take tylenol, patient stated tylenol will not help. Patient asking for something to take the edge off, so she can rest. Will forward to Dr. Eden Emms for advisement.

## 2015-04-07 NOTE — Telephone Encounter (Signed)
Left message for patient to call back  

## 2015-04-13 ENCOUNTER — Encounter (HOSPITAL_COMMUNITY): Payer: Self-pay | Admitting: Emergency Medicine

## 2015-04-13 ENCOUNTER — Ambulatory Visit (HOSPITAL_COMMUNITY)
Admission: EM | Admit: 2015-04-13 | Discharge: 2015-04-13 | Disposition: A | Discharge status: Home / Self Care | Payer: BLUE CROSS/BLUE SHIELD | Attending: Family Medicine | Admitting: Family Medicine

## 2015-04-13 DIAGNOSIS — J111 Influenza due to unidentified influenza virus with other respiratory manifestations: Secondary | ICD-10-CM | POA: Diagnosis not present

## 2015-04-13 NOTE — ED Notes (Signed)
C/o cold sx onset Friday associated w/chills, fevers, night sweats, BA, blurred vision, weakness, v/d A&O x4... No acute distress.

## 2015-04-13 NOTE — Discharge Instructions (Signed)
Influenza, Adult °Influenza ("the flu") is a viral infection of the respiratory tract. It occurs more often in winter months because people spend more time in close contact with one another. Influenza can make you feel very sick. Influenza easily spreads from person to person (contagious). °CAUSES  °Influenza is caused by a virus that infects the respiratory tract. You can catch the virus by breathing in droplets from an infected person's cough or sneeze. You can also catch the virus by touching something that was recently contaminated with the virus and then touching your mouth, nose, or eyes. °RISKS AND COMPLICATIONS °You may be at risk for a more severe case of influenza if you smoke cigarettes, have diabetes, have chronic heart disease (such as heart failure) or lung disease (such as asthma), or if you have a weakened immune system. Elderly people and pregnant women are also at risk for more serious infections. The most common problem of influenza is a lung infection (pneumonia). Sometimes, this problem can require emergency medical care and may be life threatening. °SIGNS AND SYMPTOMS  °Symptoms typically last 4 to 10 days and may include: °· Fever. °· Chills. °· Headache, body aches, and muscle aches. °· Sore throat. °· Chest discomfort and cough. °· Poor appetite. °· Weakness or feeling tired. °· Dizziness. °· Nausea or vomiting. °DIAGNOSIS  °Diagnosis of influenza is often made based on your history and a physical exam. A nose or throat swab test can be done to confirm the diagnosis. °TREATMENT  °In mild cases, influenza goes away on its own. Treatment is directed at relieving symptoms. For more severe cases, your health care provider may prescribe antiviral medicines to shorten the sickness. Antibiotic medicines are not effective because the infection is caused by a virus, not by bacteria. °HOME CARE INSTRUCTIONS °· Take medicines only as directed by your health care provider. °· Use a cool mist humidifier  to make breathing easier. °· Get plenty of rest until your temperature returns to normal. This usually takes 3 to 4 days. °· Drink enough fluid to keep your urine clear or pale yellow. °· Cover your mouth and nose when coughing or sneezing, and wash your hands well to prevent the virus from spreading. °· Stay home from work or school until the fever is gone for at least 1 full day. °PREVENTION  °An annual influenza vaccination (flu shot) is the best way to avoid getting influenza. An annual flu shot is now routinely recommended for all adults in the [place] °SEEK MEDICAL CARE IF: °· You experience chest pain, your cough worsens, or you produce more mucus. °· You have nausea, vomiting, or diarrhea. °· Your fever returns or gets worse. °SEEK IMMEDIATE MEDICAL CARE IF: °· You have trouble breathing, you become short of breath, or your skin or nails become bluish. °· You have severe pain or stiffness in the neck. °· You develop a sudden headache, or pain in the face or ear. °· You have nausea or vomiting that you cannot control. °MAKE SURE YOU:  °· Understand these instructions. °· Will watch your condition. °· Will get help right away if you are not doing well or get worse. °  °This information is not intended to replace advice given to you by your health care provider. Make sure you discuss any questions you have with your health care provider. °  °Document Released: 12/17/1999 Document Revised: 01/09/2014 Document Reviewed: 03/20/2011 °Elsevier Interactive Patient Education ©2016 [employer] ° °Cough, Adult °Coughing is a reflex that clears your throat and your airways. Coughing helps to heal and protect your lungs. It is normal to cough occasionally, but a cough that happens with other   symptoms or lasts a long time may be a sign of a condition that needs treatment. A cough may last only 2-3 weeks (acute), or it may last longer than 8 weeks (chronic). °CAUSES °Coughing is commonly caused by: °· Breathing in substances  that irritate your lungs. °· A viral or bacterial respiratory infection. °· Allergies. °· Asthma. °· Postnasal drip. °· Smoking. °· Acid backing up from the stomach into the esophagus (gastroesophageal reflux). °· Certain medicines. °· Chronic lung problems, including COPD (or rarely, lung cancer). °· Other medical conditions such as heart failure. °HOME CARE INSTRUCTIONS  °Pay attention to any changes in your symptoms. Take these actions to help with your discomfort: °· Take medicines only as told by your health care provider. °¨ If you were prescribed an antibiotic medicine, take it as told by your health care provider. Do not stop taking the antibiotic even if you start to feel better. °¨ Talk with your health care provider before you take a cough suppressant medicine. °· Drink enough fluid to keep your urine clear or pale yellow. °· If the air is dry, use a cold steam vaporizer or humidifier in your bedroom or your home to help loosen secretions. °· Avoid anything that causes you to cough at work or at home. °· If your cough is worse at night, try sleeping in a semi-upright position. °· Avoid cigarette smoke. If you smoke, quit smoking. If you need help quitting, ask your health care provider. °· Avoid caffeine. °· Avoid alcohol. °· Rest as needed. °SEEK MEDICAL CARE IF:  °· You have new symptoms. °· You cough up pus. °· Your cough does not get better after 2-3 weeks, or your cough gets worse. °· You cannot control your cough with suppressant medicines and you are losing sleep. °· You develop pain that is getting worse or pain that is not controlled with pain medicines. °· You have a fever. °· You have unexplained weight loss. °· You have night sweats. °SEEK IMMEDIATE MEDICAL CARE IF: °· You cough up blood. °· You have difficulty breathing. °· Your heartbeat is very fast. °  °This information is not intended to replace advice given to you by your health care provider. Make sure you discuss any questions you have  with your health care provider. °  °Document Released: 06/17/2010 Document Revised: 09/09/2014 Document Reviewed: 02/25/2014 °Elsevier Interactive Patient Education ©2016 [employer] Inc. ° °

## 2015-04-14 NOTE — ED Provider Notes (Signed)
CSN: [id number]     Arrival date & time 04/13/15  1731 History   First MD Initiated Contact with Patient 04/13/15 1906     Chief Complaint  Patient presents with  . URI   (Consider location/radiation/quality/duration/timing/severity/associated sxs/prior Treatment) HPI Pt presents with sore throat, body aches, fever, chills for 3-4 days Home treatment has been OTC meds without much relief of symptoms Fever is improved for short periods of time with OTC antipyretics. Pain score is 4 mostly from coughing and body aches Taking fluids, no appetite No flu shot Has been exposed to others with similar symptoms.  Denies: CP, SOB, vomiting or diarrhea.   Past Medical History  Diagnosis Date  . MI (myocardial infarction) [facility])    Past Surgical History  Procedure Laterality Date  . Gastric bypass    . Breast surgery    . Tummy tuck    . Cardiac catheterization N/A 04/06/2015    Procedure: Right/Left Heart Cath and Coronary Angiography;  Surgeon: Wendall Stade, MD;  Location: [facility] INVASIVE CV LAB;  Service: Cardiovascular;  Laterality: N/A;   No family history on file. Social History  Substance Use Topics  . Smoking status: Never Smoker   . Smokeless tobacco: None  . Alcohol Use: Yes   OB History    No data available     Review of Systems See hpi Allergies  Review of patient's allergies indicates no known allergies.  Home Medications   Prior to Admission medications   Medication Sig Start Date End Date Taking? Authorizing Provider  carvedilol (COREG) 6.25 MG tablet Take 1 tablet (6.25 mg total) by mouth 2 (two) times daily. 06/17/14  Yes Wendall Stade, MD  furosemide (LASIX) 20 MG tablet TAKE 4 TABLETS BY MOUTH X'S 3 DAYS THEN TAKE 1 TABLET BY MOUTH DAILY Patient taking differently: Take 20 mg by mouth daily.  04/02/15  Yes Janetta Hora, PA-C  lisinopril (PRINIVIL,ZESTRIL) 20 MG tablet Take 1 tablet (20 mg total) by mouth daily. 04/02/15  Yes Janetta Hora, PA-C   potassium chloride (K-DUR) 10 MEQ tablet TAKE 4 TABLETS BY MOUTH X'S 3 DAYS THEN TAKE 1 TABLET BY MOUTH DAILY Patient taking differently: Take 10 mEq by mouth daily.  04/02/15  Yes Janetta Hora, PA-C  ibuprofen (ADVIL,MOTRIN) 400 MG tablet Take 400 mg by mouth every 6 (six) hours as needed for mild pain.    Historical Provider, MD   Meds Ordered and Administered this Visit  Medications - No data to display  BP 114/63 mmHg  Pulse 71  Temp(Src) 98.5 F (36.9 C) (Oral)  SpO2 100% No data found.   Physical Exam NURSES NOTES AND VITAL SIGNS REVIEWED. CONSTITUTIONAL: Well developed, well nourished, no acute distress HEENT: normocephalic, atraumatic EYES: Conjunctiva normal NECK:normal ROM, supple, no adenopathy PULMONARY:No respiratory distress, normal effort, Lungs are clear MUSCULOSKELETAL: Normal ROM of all extremities,  SKIN: warm and dry without rash PSYCHIATRIC: Mood and affect, behavior are normal  ED Course  Procedures (including critical care time)  Labs Review Labs Reviewed - No data to display  Imaging Review No results found.   Visual Acuity Review  Right Eye Distance:   Left Eye Distance:   Bilateral Distance:    Right Eye Near:   Left Eye Near:    Bilateral Near:       Back to work note provided Advised symptomatic treatment  MDM   1. Flu syndrome     Patient is reassured that there are no issues  that require transfer to higher level of care at this time or additional tests. Patient is advised to continue home symptomatic treatment. Patient is advised that if there are new or worsening symptoms to attend the emergency department, contact primary care provider, or return to UC. Instructions of care provided discharged home in stable condition.    THIS NOTE WAS GENERATED USING A VOICE RECOGNITION SOFTWARE PROGRAM. ALL REASONABLE EFFORTS  WERE MADE TO PROOFREAD THIS DOCUMENT FOR ACCURACY.  I have verbally reviewed the discharge instructions  with the patient. A printed AVS was given to the patient.  All questions were answered prior to discharge.      Tharon Aquas, PA 04/14/15 1015

## 2015-04-16 ENCOUNTER — Ambulatory Visit (HOSPITAL_COMMUNITY)
Admission: EM | Admit: 2015-04-16 | Discharge: 2015-04-16 | Disposition: A | Discharge status: Home / Self Care | Payer: BLUE CROSS/BLUE SHIELD | Attending: Emergency Medicine | Admitting: Emergency Medicine

## 2015-04-16 ENCOUNTER — Encounter (HOSPITAL_COMMUNITY): Payer: Self-pay | Admitting: *Deleted

## 2015-04-16 ENCOUNTER — Ambulatory Visit (INDEPENDENT_AMBULATORY_CARE_PROVIDER_SITE_OTHER): Payer: BLUE CROSS/BLUE SHIELD

## 2015-04-16 DIAGNOSIS — R059 Cough, unspecified: Secondary | ICD-10-CM

## 2015-04-16 DIAGNOSIS — R05 Cough: Secondary | ICD-10-CM | POA: Diagnosis not present

## 2015-04-16 DIAGNOSIS — R110 Nausea: Secondary | ICD-10-CM | POA: Diagnosis not present

## 2015-04-16 DIAGNOSIS — J989 Respiratory disorder, unspecified: Secondary | ICD-10-CM

## 2015-04-16 DIAGNOSIS — R0989 Other specified symptoms and signs involving the circulatory and respiratory systems: Secondary | ICD-10-CM | POA: Diagnosis not present

## 2015-04-16 MED ORDER — METHYLPREDNISOLONE ACETATE 80 MG/ML IJ SUSP
80.0000 mg | Freq: Once | INTRAMUSCULAR | Status: AC
  Administered 2015-04-16: 80 mg via INTRAMUSCULAR

## 2015-04-16 MED ORDER — AMOXICILLIN 500 MG PO CAPS: 500.0000 mg | ORAL_CAPSULE | Freq: Two times a day (BID) | ORAL | Status: DC

## 2015-04-16 MED ORDER — ONDANSETRON HCL 4 MG PO TABS: 4.0000 mg | ORAL_TABLET | Freq: Four times a day (QID) | ORAL | Status: DC

## 2015-04-16 MED ORDER — METHYLPREDNISOLONE ACETATE 80 MG/ML IJ SUSP
INTRAMUSCULAR | Status: AC
  Filled 2015-04-16: qty 1

## 2015-04-16 NOTE — ED Provider Notes (Signed)
CSN: [id number]     Arrival date & time 04/16/15  1616 History   First MD Initiated Contact with Patient 04/16/15 1825     Chief Complaint  Patient presents with  . Generalized Body Aches   (Consider location/radiation/quality/duration/timing/severity/associated sxs/prior Treatment) HPI Comments: Patient is a 50 yo with a past history of MI and HTN. She also has non-compliance issues per past medical records. She presents as a f/u to her visit 3 days ago when she was diagnosed with the Flu. She returns because she "wasnt' given anything" and she still feels bad. She reports "hurting all over", continued cough, malaise, cold chills, and low grade temp. She also have nausea and vomiting that has continues. No abdominal pain.   The history is provided by the patient.    Past Medical History  Diagnosis Date  . MI (myocardial infarction) [facility])    Past Surgical History  Procedure Laterality Date  . Gastric bypass    . Breast surgery    . Tummy tuck    . Cardiac catheterization N/A 04/06/2015    Procedure: Right/Left Heart Cath and Coronary Angiography;  Surgeon: Wendall Stade, MD;  Location: [facility] INVASIVE CV LAB;  Service: Cardiovascular;  Laterality: N/A;   No family history on file. Social History  Substance Use Topics  . Smoking status: Never Smoker   . Smokeless tobacco: None  . Alcohol Use: Yes   OB History    No data available     Review of Systems  Constitutional: Positive for fever, chills and fatigue.  HENT: Positive for congestion, postnasal drip, rhinorrhea and sinus pressure.   Respiratory: Positive for cough. Negative for shortness of breath.   Gastrointestinal: Positive for nausea and vomiting.  Musculoskeletal: Positive for myalgias.  Hematological: Negative.     Allergies  Review of patient's allergies indicates no known allergies.  Home Medications   Prior to Admission medications   Medication Sig Start Date End Date Taking? Authorizing Provider   amoxicillin (AMOXIL) 500 MG capsule Take 1 capsule (500 mg total) by mouth 2 (two) times daily. 04/16/15   Riki Sheer, PA-C  carvedilol (COREG) 6.25 MG tablet Take 1 tablet (6.25 mg total) by mouth 2 (two) times daily. 06/17/14   Wendall Stade, MD  furosemide (LASIX) 20 MG tablet TAKE 4 TABLETS BY MOUTH X'S 3 DAYS THEN TAKE 1 TABLET BY MOUTH DAILY Patient taking differently: Take 20 mg by mouth daily.  04/02/15   Janetta Hora, PA-C  ibuprofen (ADVIL,MOTRIN) 400 MG tablet Take 400 mg by mouth every 6 (six) hours as needed for mild pain.    Historical Provider, MD  lisinopril (PRINIVIL,ZESTRIL) 20 MG tablet Take 1 tablet (20 mg total) by mouth daily. 04/02/15   Janetta Hora, PA-C  ondansetron (ZOFRAN) 4 MG tablet Take 1 tablet (4 mg total) by mouth every 6 (six) hours. 04/16/15   Riki Sheer, PA-C  potassium chloride (K-DUR) 10 MEQ tablet TAKE 4 TABLETS BY MOUTH X'S 3 DAYS THEN TAKE 1 TABLET BY MOUTH DAILY Patient taking differently: Take 10 mEq by mouth daily.  04/02/15   Janetta Hora, PA-C   Meds Ordered and Administered this Visit   Medications  methylPREDNISolone acetate (DEPO-MEDROL) injection 80 mg (not administered)    BP 133/84 mmHg  Pulse 103  Temp(Src) 99.1 F (37.3 C) (Oral)  SpO2 96% No data found.   Physical Exam  Constitutional: She is oriented to person, place, and time. She appears well-developed and well-nourished.  No distress.  Neck: Normal range of motion.  Cardiovascular: Normal rate and regular rhythm.   Pulmonary/Chest: Effort normal.  Mild wheeze in the bases, poor effort  Abdominal: Soft. Bowel sounds are normal. She exhibits no mass. There is no tenderness. There is no rebound and no guarding.  Lymphadenopathy:    She has no cervical adenopathy.  Neurological: She is alert and oriented to person, place, and time.  Skin: Skin is warm and dry. She is not diaphoretic.  Psychiatric: Her behavior is normal.  Nursing note and vitals  reviewed.   ED Course  Procedures (including critical care time)  Labs Review Labs Reviewed - No data to display  Imaging Review Dg Chest 2 View  04/16/2015  CLINICAL DATA:  Cough, chest congestion and fever for over a week. EXAM: CHEST  2 VIEW COMPARISON:  01/02/2015. FINDINGS: Interval mild enlargement of the cardiac silhouette. Clear lungs with normal vascularity. Mild thoracic spine degenerative changes. IMPRESSION: Interval mild cardiomegaly.  No acute abnormality. Electronically Signed   By: Beckie Salts M.D.   On: 04/16/2015 19:06     Visual Acuity Review  Right Eye Distance:   Left Eye Distance:   Bilateral Distance:    Right Eye Near:   Left Eye Near:    Bilateral Near:         MDM   1. Nausea   2. Cough   3. Chest congestion   4. Respiratory illness    Patient non-toxic. Chest xray is normal. Mild elevated BP, but instructed to take BP medications as directed( did not have today). Treat for lingering bacterial infection given duration. Depomedrol 80mg  IM for overall fatigue. Work note given. F/U in the ED if she worsens.     Riki Sheer, PA-C 04/16/15 1926

## 2015-04-16 NOTE — Discharge Instructions (Signed)
Cover for bacterial infection since this illness is ongoing. No signs of pneumonia by xray. Exam is good. Steroid shot will help overall fatigue and inflammation. If you worsen please go to the ER. Drink lots of fluids.  Cough, Adult Coughing is a reflex that clears your throat and your airways. Coughing helps to heal and protect your lungs. It is normal to cough occasionally, but a cough that happens with other symptoms or lasts a long time may be a sign of a condition that needs treatment. A cough may last only 2-3 weeks (acute), or it may last longer than 8 weeks (chronic). CAUSES Coughing is commonly caused by:  Breathing in substances that irritate your lungs.  A viral or bacterial respiratory infection.  Allergies.  Asthma.  Postnasal drip.  Smoking.  Acid backing up from the stomach into the esophagus (gastroesophageal reflux).  Certain medicines.  Chronic lung problems, including COPD (or rarely, lung cancer).  Other medical conditions such as heart failure. HOME CARE INSTRUCTIONS  Pay attention to any changes in your symptoms. Take these actions to help with your discomfort:  Take medicines only as told by your health care provider.  If you were prescribed an antibiotic medicine, take it as told by your health care provider. Do not stop taking the antibiotic even if you start to feel better.  Talk with your health care provider before you take a cough suppressant medicine.  Drink enough fluid to keep your urine clear or pale yellow.  If the air is dry, use a cold steam vaporizer or humidifier in your bedroom or your home to help loosen secretions.  Avoid anything that causes you to cough at work or at home.  If your cough is worse at night, try sleeping in a semi-upright position.  Avoid cigarette smoke. If you smoke, quit smoking. If you need help quitting, ask your health care provider.  Avoid caffeine.  Avoid alcohol.  Rest as needed. SEEK MEDICAL CARE IF:    You have new symptoms.  You cough up pus.  Your cough does not get better after 2-3 weeks, or your cough gets worse.  You cannot control your cough with suppressant medicines and you are losing sleep.  You develop pain that is getting worse or pain that is not controlled with pain medicines.  You have a fever.  You have unexplained weight loss.  You have night sweats. SEEK IMMEDIATE MEDICAL CARE IF:  You cough up blood.  You have difficulty breathing.  Your heartbeat is very fast.   This information is not intended to replace advice given to you by your health care provider. Make sure you discuss any questions you have with your health care provider.   Document Released: 06/17/2010 Document Revised: 09/09/2014 Document Reviewed: 02/25/2014 Elsevier Interactive Patient Education [employer]

## 2015-04-16 NOTE — ED Notes (Addendum)
PT SEEN   3  DAYS  AGO  FOR      BODY  ACHES  CHILLS     RUNNY  NOSE     SHE           WAS   SEEN  SEV  DAYS  AGO FOR PRESUMED    FLU         PT   REPORTS    NOT  FEELING  ANY BETTER

## 2015-07-22 ENCOUNTER — Telehealth: Payer: Self-pay | Admitting: Cardiovascular Disease

## 2015-07-22 MED ORDER — CARVEDILOL 6.25 MG PO TABS: 6.2500 mg | ORAL_TABLET | Freq: Two times a day (BID) | ORAL | Status: AC

## 2015-07-22 NOTE — Telephone Encounter (Signed)
New message      Talk to a nurse regarding medication.  Pt said she was supposed to have a new medication called in after heart cath in April but nothing was called in.  She does not know the name of the medication.  Please call

## 2015-07-22 NOTE — Telephone Encounter (Signed)
Called patient back. Informed patient that according to her chart, she was to increase her Furosamide and potassium for 3 days after her heart cath. Patient stated she did this after her heart cath. Patient stated she is out of her Coreg, so sent refill in to patient's pharmacy. Patient stated she wanted a follow-up appointment with Dr. Eden Emms. Made patient an appointment, first available is 09/03/15. Patient stated she is still having some weakness, but she has been out of her medication. Patient will start taking her coreg again, and see if this helps. Encouraged patient to call with any other questions or concerns. Patient verbalized understanding and agreed to plan.

## 2015-08-24 ENCOUNTER — Encounter (HOSPITAL_COMMUNITY): Payer: Self-pay | Admitting: *Deleted

## 2015-08-24 ENCOUNTER — Ambulatory Visit (HOSPITAL_COMMUNITY)
Admission: EM | Admit: 2015-08-24 | Discharge: 2015-08-24 | Disposition: A | Discharge status: Home / Self Care | Payer: BLUE CROSS/BLUE SHIELD | Attending: Family Medicine | Admitting: Family Medicine

## 2015-08-24 DIAGNOSIS — I252 Old myocardial infarction: Secondary | ICD-10-CM | POA: Diagnosis not present

## 2015-08-24 DIAGNOSIS — R300 Dysuria: Secondary | ICD-10-CM | POA: Insufficient documentation

## 2015-08-24 DIAGNOSIS — N390 Urinary tract infection, site not specified: Secondary | ICD-10-CM | POA: Diagnosis not present

## 2015-08-24 DIAGNOSIS — R350 Frequency of micturition: Secondary | ICD-10-CM | POA: Insufficient documentation

## 2015-08-24 LAB — POCT URINALYSIS DIP (DEVICE)
BILIRUBIN URINE: NEGATIVE
GLUCOSE, UA: NEGATIVE mg/dL
Hgb urine dipstick: NEGATIVE
Ketones, ur: NEGATIVE mg/dL
NITRITE: NEGATIVE
PH: 5.5 (ref 5.0–8.0)
Protein, ur: NEGATIVE mg/dL
Specific Gravity, Urine: 1.025 (ref 1.005–1.030)
Urobilinogen, UA: 0.2 mg/dL (ref 0.0–1.0)

## 2015-08-24 MED ORDER — FLUCONAZOLE 150 MG PO TABS: 150.0000 mg | ORAL_TABLET | Freq: Once | ORAL | 1 refills | Status: AC

## 2015-08-24 MED ORDER — CEPHALEXIN 500 MG PO CAPS: 500.0000 mg | ORAL_CAPSULE | Freq: Four times a day (QID) | ORAL | 0 refills | Status: DC

## 2015-08-24 NOTE — ED Provider Notes (Signed)
[facility]    CSN: [id number] Arrival date & time: 08/24/15  1746  First Provider Contact:  First MD Initiated Contact with Patient 08/24/15 1846        History   Chief Complaint Chief Complaint  Patient presents with  . Urinary Tract Infection    HPI Jenna Beltran is a 50 y.o. female.    Dysuria  Pain quality:  Burning and sharp Pain severity:  Mild Onset quality:  Gradual Duration:  1 week Progression:  Unchanged Chronicity:  New (concern of female friend may have cheated on her.) Recent urinary tract infections: no   Relieved by:  None tried Worsened by:  Nothing Ineffective treatments:  None tried Urinary symptoms: frequent urination   Urinary symptoms: no hematuria   Associated symptoms: abdominal pain   Associated symptoms: no fever, no flank pain, no nausea, no vaginal discharge and no vomiting   Risk factors: sexually active   Risk factors: no recurrent urinary tract infections     Past Medical History:  Diagnosis Date  . MI (myocardial infarction) (H[facility])     There are no active problems to display for this patient.   Past Surgical History:  Procedure Laterality Date  . BREAST SURGERY    . CARDIAC CATHETERIZATION N/A 04/06/2015   Procedure: Right/Left Heart Cath and Coronary Angiography;  Surgeon: Wendall Stade, MD;  Location: [facility] INVASIVE CV LAB;  Service: Cardiovascular;  Laterality: N/A;  . GASTRIC BYPASS    . tummy tuck      OB History    No data available       Home Medications    Prior to Admission medications   Medication Sig Start Date End Date Taking? Authorizing Provider  amoxicillin (AMOXIL) 500 MG capsule Take 1 capsule (500 mg total) by mouth 2 (two) times daily. 04/16/15   Riki Sheer, PA-C  carvedilol (COREG) 6.25 MG tablet Take 1 tablet (6.25 mg total) by mouth 2 (two) times daily. 07/22/15   Wendall Stade, MD  furosemide (LASIX) 20 MG tablet TAKE 4 TABLETS BY MOUTH X'S 3 DAYS THEN TAKE 1 TABLET BY MOUTH  DAILY Patient taking differently: Take 20 mg by mouth daily.  04/02/15   Janetta Hora, PA-C  ibuprofen (ADVIL,MOTRIN) 400 MG tablet Take 400 mg by mouth every 6 (six) hours as needed for mild pain.    Historical Provider, MD  lisinopril (PRINIVIL,ZESTRIL) 20 MG tablet Take 1 tablet (20 mg total) by mouth daily. 04/02/15   Janetta Hora, PA-C  ondansetron (ZOFRAN) 4 MG tablet Take 1 tablet (4 mg total) by mouth every 6 (six) hours. 04/16/15   Riki Sheer, PA-C  potassium chloride (K-DUR) 10 MEQ tablet TAKE 4 TABLETS BY MOUTH X'S 3 DAYS THEN TAKE 1 TABLET BY MOUTH DAILY Patient taking differently: Take 10 mEq by mouth daily.  04/02/15   Janetta Hora, PA-C    Family History History reviewed. No pertinent family history.  Social History Social History  Substance Use Topics  . Smoking status: Never Smoker  . Smokeless tobacco: Never Used  . Alcohol use Yes     Allergies   Review of patient's allergies indicates no known allergies.   Review of Systems Review of Systems  Constitutional: Negative.  Negative for fever.  Gastrointestinal: Positive for abdominal pain. Negative for nausea and vomiting.  Genitourinary: Positive for dysuria, frequency and pelvic pain. Negative for flank pain, vaginal discharge and vaginal pain.  All other systems reviewed and are  negative.    Physical Exam Triage Vital Signs ED Triage Vitals  Enc Vitals Group     BP 08/24/15 1805 155/97     Pulse Rate 08/24/15 1805 73     Resp 08/24/15 1805 18     Temp 08/24/15 1805 98.8 F (37.1 C)     Temp Source 08/24/15 1805 Oral     SpO2 08/24/15 1805 97 %     Weight 08/24/15 1805 195 lb (88.5 kg)     Height 08/24/15 1805 5\' 1"  (1.549 m)     Head Circumference --      Peak Flow --      Pain Score 08/24/15 1819 7     Pain Loc --      Pain Edu? --      Excl. in GC? --    No data found.   Updated Vital Signs BP 155/97 (BP Location: Left Wrist)   Pulse 73   Temp 98.8 F (37.1 C)  (Oral)   Resp 18   Ht 5\' 1"  (1.549 m)   Wt 195 lb (88.5 kg)   SpO2 97%   BMI 36.84 kg/m   Visual Acuity Right Eye Distance:   Left Eye Distance:   Bilateral Distance:    Right Eye Near:   Left Eye Near:    Bilateral Near:     Physical Exam  Constitutional: She is oriented to person, place, and time. She appears well-developed and well-nourished. No distress.  Neck: Normal range of motion. Neck supple.  Abdominal: Soft. Bowel sounds are normal. She exhibits no distension and no mass. There is tenderness. There is no rebound and no guarding. No hernia.  Lymphadenopathy:    She has no cervical adenopathy.  Neurological: She is alert and oriented to person, place, and time.  Skin: Skin is warm and dry.  Nursing note and vitals reviewed.    UC Treatments / Results  Labs (all labs ordered are listed, but only abnormal results are displayed) Labs Reviewed  POCT URINALYSIS DIP (DEVICE) - Abnormal; Notable for the following:       Result Value   Leukocytes, UA SMALL (*)    All other components within normal limits  URINE CYTOLOGY ANCILLARY ONLY    EKG  EKG Interpretation None       Radiology No results found.  Procedures Procedures (including critical care time)  Medications Ordered in UC Medications - No data to display   Initial Impression / Assessment and Plan / UC Course  I have reviewed the triage vital signs and the nursing notes.  Pertinent labs & imaging results that were available during my care of the patient were reviewed by me and considered in my medical decision making (see chart for details).  Clinical Course      Final Clinical Impressions(s) / UC Diagnoses   Final diagnoses:  None    New Prescriptions New Prescriptions   No medications on file     Linna HoffJames D Ayan Yankey, MD 08/24/15 1859

## 2015-08-24 NOTE — ED Triage Notes (Addendum)
Pt reports   Symptoms  Of  Back pain    With urinary  Frequency   As    Well  As   Some  Burning  As    Well    Symptoms   X  1  Week     Pt  denys any  specefic  injury pt  [place]   Wants  To  Be   Checked  For  Possible  Std   As  Well

## 2015-08-24 NOTE — Discharge Instructions (Signed)
Take all of medicine as directed, drink lots of fluids, see your doctor if further problems. °

## 2015-08-25 LAB — URINE CYTOLOGY ANCILLARY ONLY
CHLAMYDIA, DNA PROBE: NEGATIVE
Neisseria Gonorrhea: NEGATIVE
Trichomonas: NEGATIVE

## 2015-09-02 NOTE — Progress Notes (Deleted)
Cardiology Office Note   Date:  09/02/2015   ID:  Jenna Beltran, DOB 05-Aug-1965, MRN [id number]  PCP:  No PCP Per Patient  Cardiologist: Dr. Eden Emms  Chest pain    History of Present Illness: Jenna Beltran is a 50 y.o. female with a history of gastric bypass surgery, obesity, LBBB, chronic systolic CHF (EF 11-91%), HTN, non compliance and pulmonary nodules who presents to clinic for post cath   She was seen in the ER on 04/12/14 for evaluation of uncontrolled hypertension as well as CHF in the setting of not taking her antihypertensives in a month. Close outpatient cardiology was recommended. She established care with me  on 05/21/14. She complained of dyspnea and intermittent chest pain. He ordered a nuclear stress test and 2-D echo as well as a chest CT to follow-up after previously abnormal chest x-ray. 2-D echo showed a moderately dilated left ventricle, mild LVH with severe LV systolic dysfunction with an EF of 25-30% with akinesis of the anteroseptal myocardium, G1 DD and mild LA dilation. Stopped Norvasc and added Coreg to her lisinopril. .    Chest CT showed peri-fissural pulmonary nodules with follow-up CT recommended in 12 months.  Right and left cath done 04/06/15 Reviewed Normal right heart pressures  Mean RA: 6 mmHg RV: 33/2 mmHg PA 34/18 mean 25 mmHg PCWP mean 13 mmHg CI: 3.65 L/m2 Aortic: 126/76 mmHg LV: 148/6 mmHg  Normal coronary arteries     Past Medical History:  Diagnosis Date  . MI (myocardial infarction) Jenna Beltran)     Past Surgical History:  Procedure Laterality Date  . BREAST SURGERY    . CARDIAC CATHETERIZATION N/A 04/06/2015   Procedure: Right/Left Heart Cath and Coronary Angiography;  Surgeon: Wendall Stade, MD;  Location: [facility] INVASIVE CV LAB;  Service: Cardiovascular;  Laterality: N/A;  . GASTRIC BYPASS    . tummy tuck       Current Outpatient Prescriptions  Medication Sig Dispense Refill  . amoxicillin (AMOXIL) 500 MG capsule Take 1 capsule (500  mg total) by mouth 2 (two) times daily. 20 capsule 0  . carvedilol (COREG) 6.25 MG tablet Take 1 tablet (6.25 mg total) by mouth 2 (two) times daily. 180 tablet 3  . cephALEXin (KEFLEX) 500 MG capsule Take 1 capsule (500 mg total) by mouth 4 (four) times daily. Take all of medicine and drink lots of fluids 20 capsule 0  . furosemide (LASIX) 20 MG tablet TAKE 4 TABLETS BY MOUTH X'S 3 DAYS THEN TAKE 1 TABLET BY MOUTH DAILY (Patient taking differently: Take 20 mg by mouth daily. ) 42 tablet 3  . ibuprofen (ADVIL,MOTRIN) 400 MG tablet Take 400 mg by mouth every 6 (six) hours as needed for mild pain.    Marland Kitchen lisinopril (PRINIVIL,ZESTRIL) 20 MG tablet Take 1 tablet (20 mg total) by mouth daily. 30 tablet 3  . ondansetron (ZOFRAN) 4 MG tablet Take 1 tablet (4 mg total) by mouth every 6 (six) hours. 12 tablet 0  . potassium chloride (K-DUR) 10 MEQ tablet TAKE 4 TABLETS BY MOUTH X'S 3 DAYS THEN TAKE 1 TABLET BY MOUTH DAILY (Patient taking differently: Take 10 mEq by mouth daily. ) 42 tablet 3   No current facility-administered medications for this visit.     Allergies:   Review of patient's allergies indicates no known allergies.    Social History:  The patient  reports that she has never smoked. She has never used smokeless tobacco. She reports that she drinks alcohol. She  reports that she does not use drugs.   Family History:  Father died in 2760s of unknown causes. Mother died at 6855 from a blockage in her colon. No HNT, HLD or DM in her family that she knows of. She does not talk to her brother or know his history.    ROS:  Please see the history of present illness.   Otherwise, review of systems are positive for none.   All other systems are reviewed and negative.    PHYSICAL EXAM: VS:  There were no vitals taken for this visit. , BMI There is no height or weight on file to calculate BMI. GEN: Well nourished, well developed, in no acute distress. obese HEENT: normal  Neck: + JVD, carotid bruits, or  masses Cardiac: RRR; no murmurs, rubs, or gallops,no edema  Respiratory:  clear to auscultation bilaterally, decreased breath sounds at bases. GI: soft, nontender, nondistended, + BS MS: no deformity or atrophy  Skin: warm and dry, no rash Neuro:  Strength and sensation are intact Psych: euthymic mood, full affect   EKG:    LBBB, NSR HR 69   Recent Labs: 01/02/2015: B Natriuretic Peptide 624.1 04/02/2015: Hemoglobin 14.9; Platelets 231 04/05/2015: BUN 16; Creat 0.62; Potassium 3.9; Sodium 140    Lipid Panel No results found for: CHOL, TRIG, HDL, CHOLHDL, VLDL, LDLCALC, LDLDIRECT    Wt Readings from Last 3 Encounters:  08/24/15 195 lb (88.5 kg)  04/06/15 192 lb (87.1 kg)  04/02/15 202 lb 9.6 oz (91.9 kg)      Other studies Reviewed: Additional studies/ records that were reviewed today include: 2D ECHO. Review of the above records demonstrates:   2D ECHO: 06/15/2014 LV EF: 25% - 30% Study Conclusions - Left ventricle: The cavity size was moderately dilated. Wall  thickness was increased in a pattern of mild LVH. Systolic  function was severely reduced. The estimated ejection fraction  was in the range of 25% to 30%. There is akinesis of the  anteroseptal myocardium. Doppler parameters are consistent with  abnormal left ventricular relaxation (grade 1 diastolic  dysfunction). Doppler parameters are consistent with high  ventricular filling pressure. - Ventricular septum: Septal motion showed abnormal function and  dyssynergy. - Left atrium: The atrium was mildly dilated  Chest CT 05/21/15 IMPRESSION: 1. Pulmonary artery enlargement suggests pulmonary arterial hypertension. The right hilar soft tissue fullness was likely related to an enlarged right pulmonary artery. No hilar adenopathy or mass. 2. Mild cardiomegaly. 3. Perifissural pulmonary nodules may represent subpleural lymph nodes. The largest measures 6 mm. If the patient is at high risk  for bronchogenic carcinoma, follow-up chest CT at 6-12 months is recommended. If the patient is at low risk for bronchogenic carcinoma, follow-up chest CT at 12 months is recommended.   ASSESSMENT AND PLAN:  Jenna Beltran is a 50 y.o. female with a history of gastric bypass surgery, obesity, LBBB, chronic systolic CHF (EF 16-10%25-30%), HTN, non compliance and pulmonary nodules who presents to clinic for evaluation of chest pain.  Chronic systolic CHF: she appears mildly volume overloaded/ -- 2-D echo showed a moderately dilated left ventricle, mild LVH with severe LV systolic dysfunction with an EF of 25-30% with akinesis of the anteroseptal myocardium, G1DD and mild LA dilation.  -- Continue Coreg and lisinopril.Lasix  - Cardiac MRI to further risk stratify for AICD .and CRT since she has LBBB   Chest pain: non cardiac normal cors by cath 04/06/15    HTN: Continue Coreg 6.25 twice a day  and lisinopril 20 mg daily  Pulmonary nodules: chest CT (05/2014) showed peri-fissural pulmonary nodules with follow-up CT recommended in 12 months (since she is a nonsmoker at low risk for bronchogenic carcinoma.) This will be due in 06/2015.  Noncompliance: She has a history of not taking her medicines and has been lost to follow-up when clear follow-up was arranged. I have stressed the importance of making her appointments and going to her tests and taking her medicines.   Charlton Haws

## 2015-09-03 ENCOUNTER — Ambulatory Visit: Payer: BLUE CROSS/BLUE SHIELD | Admitting: Cardiovascular Disease

## 2015-09-08 ENCOUNTER — Encounter: Payer: Self-pay | Admitting: Cardiovascular Disease

## 2015-09-14 ENCOUNTER — Ambulatory Visit: Payer: BLUE CROSS/BLUE SHIELD | Admitting: Cardiology

## 2015-09-16 ENCOUNTER — Encounter: Payer: Self-pay | Admitting: Cardiology

## 2015-09-18 ENCOUNTER — Emergency Department (HOSPITAL_COMMUNITY)
Admission: EM | Admit: 2015-09-18 | Discharge: 2015-09-18 | Disposition: A | Discharge status: Home / Self Care | Payer: BLUE CROSS/BLUE SHIELD | Attending: Emergency Medicine | Admitting: Emergency Medicine

## 2015-09-18 ENCOUNTER — Emergency Department (HOSPITAL_COMMUNITY): Payer: BLUE CROSS/BLUE SHIELD

## 2015-09-18 ENCOUNTER — Other Ambulatory Visit: Payer: Self-pay | Admitting: Physician Assistant

## 2015-09-18 ENCOUNTER — Encounter (HOSPITAL_COMMUNITY): Payer: Self-pay

## 2015-09-18 DIAGNOSIS — I509 Heart failure, unspecified: Secondary | ICD-10-CM | POA: Diagnosis not present

## 2015-09-18 DIAGNOSIS — I252 Old myocardial infarction: Secondary | ICD-10-CM | POA: Diagnosis not present

## 2015-09-18 DIAGNOSIS — R079 Chest pain, unspecified: Secondary | ICD-10-CM | POA: Diagnosis not present

## 2015-09-18 DIAGNOSIS — I110 Hypertensive heart disease with heart failure: Secondary | ICD-10-CM | POA: Diagnosis not present

## 2015-09-18 DIAGNOSIS — R10813 Right lower quadrant abdominal tenderness: Secondary | ICD-10-CM | POA: Diagnosis not present

## 2015-09-18 DIAGNOSIS — I2510 Atherosclerotic heart disease of native coronary artery without angina pectoris: Secondary | ICD-10-CM | POA: Insufficient documentation

## 2015-09-18 DIAGNOSIS — Z79899 Other long term (current) drug therapy: Secondary | ICD-10-CM | POA: Insufficient documentation

## 2015-09-18 DIAGNOSIS — R0602 Shortness of breath: Secondary | ICD-10-CM | POA: Diagnosis not present

## 2015-09-18 HISTORY — DX: Essential (primary) hypertension: I10

## 2015-09-18 LAB — PREGNANCY, URINE: Preg Test, Ur: NEGATIVE

## 2015-09-18 LAB — WET PREP, GENITAL
Clue Cells Wet Prep HPF POC: NONE SEEN
Sperm: NONE SEEN
Trich, Wet Prep: NONE SEEN
Yeast Wet Prep HPF POC: NONE SEEN

## 2015-09-18 LAB — BASIC METABOLIC PANEL
ANION GAP: 10 (ref 5–15)
BUN: 23 mg/dL — ABNORMAL HIGH (ref 6–20)
CHLORIDE: 104 mmol/L (ref 101–111)
CO2: 24 mmol/L (ref 22–32)
Calcium: 10.3 mg/dL (ref 8.9–10.3)
Creatinine, Ser: 0.87 mg/dL (ref 0.44–1.00)
GFR calc non Af Amer: >60 mL/min (ref >60)
GLUCOSE: 110 mg/dL — AB (ref 65–99)
Potassium: 4.2 mmol/L (ref 3.5–5.1)
Sodium: 138 mmol/L (ref 135–145)

## 2015-09-18 LAB — CBC
HEMATOCRIT: 43.5 % (ref 36.0–46.0)
HEMOGLOBIN: 14.3 g/dL (ref 12.0–15.0)
MCH: 32.2 pg (ref 26.0–34.0)
MCHC: 32.9 g/dL (ref 30.0–36.0)
MCV: 98 fL (ref 78.0–100.0)
Platelets: 210 10*3/uL (ref 150–400)
RBC: 4.44 MIL/uL (ref 3.87–5.11)
RDW: 12.6 % (ref 11.5–15.5)
WBC: 5 10*3/uL (ref 4.0–10.5)

## 2015-09-18 LAB — URINALYSIS, ROUTINE W REFLEX MICROSCOPIC
Bilirubin Urine: NEGATIVE
Glucose, UA: NEGATIVE mg/dL
Hgb urine dipstick: NEGATIVE
Ketones, ur: NEGATIVE mg/dL
Leukocytes, UA: NEGATIVE
Nitrite: NEGATIVE
Protein, ur: NEGATIVE mg/dL
Specific Gravity, Urine: 1.01 (ref 1.005–1.030)
pH: 5 (ref 5.0–8.0)

## 2015-09-18 LAB — I-STAT TROPONIN, ED
TROPONIN I, POC: 0.02 ng/mL (ref 0.00–0.08)
Troponin i, poc: 0.02 ng/mL (ref 0.00–0.08)

## 2015-09-18 LAB — BRAIN NATRIURETIC PEPTIDE: B Natriuretic Peptide: 84.6 pg/mL (ref 0.0–100.0)

## 2015-09-18 MED ORDER — AZITHROMYCIN 250 MG PO TABS
1000.0000 mg | ORAL_TABLET | Freq: Once | ORAL | Status: AC
  Administered 2015-09-18: 1000 mg via ORAL
  Filled 2015-09-18: qty 4

## 2015-09-18 MED ORDER — IOPAMIDOL (ISOVUE-300) INJECTION 61%
INTRAVENOUS | Status: AC
  Administered 2015-09-18: 100 mL
  Filled 2015-09-18: qty 100

## 2015-09-18 MED ORDER — LIDOCAINE HCL (PF) 1 % IJ SOLN
INTRAMUSCULAR | Status: AC
  Administered 2015-09-18: 1 mL
  Filled 2015-09-18: qty 5

## 2015-09-18 MED ORDER — CEFTRIAXONE SODIUM 250 MG IJ SOLR
250.0000 mg | Freq: Once | INTRAMUSCULAR | Status: AC
  Administered 2015-09-18: 250 mg via INTRAMUSCULAR
  Filled 2015-09-18: qty 250

## 2015-09-18 NOTE — ED Provider Notes (Signed)
Hand-off from Glenford Bayley, PA-C. Pending delta trop.  See initial note for full history of present illness.  Briefly patient is a 50 year old female with history of MI, CAD, CHF, hypertension who presents the ED with worsening chest heaviness and shortness of breath for the past 3 days. Symptoms worse on exertion and notes it feels similar to problems she has had in the past related to CHF. Patient also concern for STD exposure as her boyfriend recently cheated on her and she is having vaginal odor.  Physical Exam  BP 159/89   Pulse 72   Temp 98.6 F (37 C) (Oral)   Resp 20   Ht 5\' 1"  (1.549 m)   Wt 88.5 kg   SpO2 98%   BMI 36.84 kg/m   Physical Exam  Constitutional: She is oriented to person, place, and time. She appears well-developed and well-nourished. No distress.  HENT:  Head: Normocephalic and atraumatic.  Mouth/Throat: Oropharynx is clear and moist. No oropharyngeal exudate.  Eyes: Conjunctivae and EOM are normal. Right eye exhibits no discharge. Left eye exhibits no discharge. No scleral icterus.  Neck: Normal range of motion. Neck supple.  Cardiovascular: Normal rate, regular rhythm, normal heart sounds and intact distal pulses.   Pulmonary/Chest: Effort normal and breath sounds normal. No respiratory distress. She has no wheezes. She has no rales. She exhibits no tenderness.  Abdominal: Soft. Bowel sounds are normal. She exhibits no distension and no mass. There is no tenderness. There is no rebound and no guarding.  Musculoskeletal: Normal range of motion. She exhibits no edema.  Neurological: She is alert and oriented to person, place, and time.  Skin: Skin is warm and dry. She is not diaphoretic.  Nursing note and vitals reviewed.   ED Course  Procedures  MDM Patient presents with chest heaviness and shortness of breath that has worsened over the past 3 days. History of CAD, hypertension and CHF. Exam performed by initial provider unremarkable, lungs clear to  auscultation bilaterally, no leg swelling, RRR. VSS. EKG showed sinus rhythm with LBBB, no acute ischemic changes. Trop negative. CXR negative. BNP 84.6. Remaining labs unremarkable. Initial provider spoke with Dr. Royann Shivers who reported the patient is on all the correct medications, and that the patient did not need to admitted to the hospital for further treatment at this time. He stated that he would expedite the process of the patient being seen in the office. Delta trop pending.  Patient also reported having   Delta troponin negative. On my initial evaluation, patient is resting comfortably in bed and reports that she is ready to be discharged home. She reports continuing to have "heaviness" which she feels all over but denies any other pain or complaints at this time. My initial examination unremarkable. Discussed results and plan for discharge with patient. Advised patient to continue taking her home medications as prescribed and follow up with Dr. Royann Shivers on Monday. Discussed return precautions.      Satira Sark [place], [place] 09/18/15 1946    Pricilla Loveless, MD 11/02/17 1239

## 2015-09-18 NOTE — ED Provider Notes (Signed)
[facility]-EMERGENCY DEPT Provider Note   CSN: [id number] Arrival date & time: 09/18/15  1023     History   Chief Complaint Chief Complaint  Patient presents with  . Chest Pain    HPI Jenna Beltran is a 50 y.o. female with history of MI, CAD, CHF, hypertension who presents with a three-day history of worsening chest heaviness and shortness of breath. Both symptoms are worse on exertion. Patient states her symptoms feel similar to her past problems with her heart failure. Patient has had increasing "bloating" throughout her body, blurred vision, and lightheadedness. Patient reports prior worsening fatigue over the past couple weeks. Patient reports right lower quadrant bloating and pain. Patient denies any nausea or vomiting, urinary symptoms. Patient has concern for STD exposure, as her boyfriend cheated on her and she has had a new vaginal odor. Patient was seen in her primary care providers office last week and treated for UTI, however nothing was done about her STD concern. Patient denies any vaginal discharge or abnormal bleeding. Patient has not had a period in "a few years."Patient denies any recent long trips, estrogen use, recent surgeries or cancer, smoking, new leg pain or swelling.  HPI  Past Medical History:  Diagnosis Date  . Coronary artery disease   . Hypertension   . MI (myocardial infarction) (HCC)     There are no active problems to display for this patient.   Past Surgical History:  Procedure Laterality Date  . BREAST SURGERY    . CARDIAC CATHETERIZATION N/A 04/06/2015   Procedure: Right/Left Heart Cath and Coronary Angiography;  Surgeon: Wendall StadePeter C Nishan, MD;  [facility];  Service: Cardiovascular;  Laterality: N/A;  . GASTRIC BYPASS    . tummy tuck      OB History    No data available       Home Medications    Prior to Admission medications   Medication Sig Start Date End Date Taking? Authorizing Provider  carvedilol (COREG) 6.25 MG tablet  Take 1 tablet (6.25 mg total) by mouth 2 (two) times daily. 07/22/15  Yes Wendall StadePeter C Nishan, MD  furosemide (LASIX) 20 MG tablet TAKE 4 TABLETS BY MOUTH X'S 3 DAYS THEN TAKE 1 TABLET BY MOUTH DAILY Patient taking differently: Take 20 mg by mouth daily.  04/02/15  Yes Janetta HoraKathryn R Thompson, PA-C  ibuprofen (ADVIL,MOTRIN) 400 MG tablet Take 400 mg by mouth every 6 (six) hours as needed for mild pain.   Yes Historical Provider, MD  lisinopril (PRINIVIL,ZESTRIL) 20 MG tablet Take 1 tablet (20 mg total) by mouth daily. 04/02/15  Yes Janetta HoraKathryn R Thompson, PA-C  potassium chloride (K-DUR) 10 MEQ tablet TAKE 4 TABLETS BY MOUTH X'S 3 DAYS THEN TAKE 1 TABLET BY MOUTH DAILY Patient taking differently: Take 10 mEq by mouth daily.  04/02/15  Yes Janetta HoraKathryn R Thompson, PA-C    Family History No family history on file.  Social History Social History  Substance Use Topics  . Smoking status: Never Smoker  . Smokeless tobacco: Never Used  . Alcohol use Yes     Allergies   Review of patient's allergies indicates no known allergies.   Review of Systems Review of Systems  Constitutional: Negative for chills, diaphoresis and fever.  HENT: Negative for facial swelling and sore throat.   Eyes: Positive for visual disturbance (blurry vision).  Respiratory: Positive for shortness of breath.   Cardiovascular: Positive for chest pain (heaviness).  Gastrointestinal: Positive for abdominal distention and abdominal pain (RLQ). Negative  for nausea and vomiting.  Genitourinary: Negative for dysuria.  Musculoskeletal: Negative for back pain.  Skin: Negative for rash and wound.  Neurological: Negative for headaches.  Psychiatric/Behavioral: The patient is not nervous/anxious.      Physical Exam Updated Vital Signs BP 159/89   Pulse 72   Temp 98.6 F (37 C) (Oral)   Resp 20   Ht 5\' 1"  (1.549 m)   Wt 88.5 kg   SpO2 98%   BMI 36.84 kg/m   Physical Exam  Constitutional: She appears well-developed and  well-nourished. No distress.  HENT:  Head: Normocephalic and atraumatic.  Mouth/Throat: Oropharynx is clear and moist. No oropharyngeal exudate.  Eyes: Conjunctivae are normal. Pupils are equal, round, and reactive to light. Right eye exhibits no discharge. Left eye exhibits no discharge. No scleral icterus.  Neck: Normal range of motion. Neck supple. No thyromegaly present.  Cardiovascular: Normal rate, regular rhythm, normal heart sounds and intact distal pulses.  Exam reveals no gallop and no friction rub.   No murmur heard. Pulmonary/Chest: Effort normal and breath sounds normal. No stridor. No respiratory distress. She has no wheezes. She has no rales.  Abdominal: Soft. Bowel sounds are normal. She exhibits no distension. There is tenderness in the right lower quadrant. There is tenderness at McBurney's point. There is no rebound, no guarding and no CVA tenderness.    Genitourinary: There is no rash, tenderness or lesion on the right labia. There is no rash, tenderness or lesion on the left labia. Uterus is not tender. Cervix exhibits discharge (white). Cervix exhibits no motion tenderness. Right adnexum displays no mass, no tenderness and no fullness. Left adnexum displays no mass, no tenderness and no fullness. No bleeding in the vagina. Vaginal discharge found.  Musculoskeletal: She exhibits no edema.  No calf TTP bilaterally  Lymphadenopathy:    She has no cervical adenopathy.  Neurological: She is alert. Coordination normal.  Skin: Skin is warm and dry. No rash noted. She is not diaphoretic. No pallor.  Psychiatric: She has a normal mood and affect.  Nursing note and vitals reviewed.    ED Treatments / Results  Labs (all labs ordered are listed, but only abnormal results are displayed) Labs Reviewed  WET PREP, GENITAL - Abnormal; Notable for the following:       Result Value   WBC, Wet Prep HPF POC PRESENT (*)    All other components within normal limits  BASIC METABOLIC  PANEL - Abnormal; Notable for the following:    Glucose, Bld 110 (*)    BUN 23 (*)    All other components within normal limits  CBC  URINALYSIS, ROUTINE W REFLEX MICROSCOPIC (NOT AT [facility])  BRAIN NATRIURETIC PEPTIDE  PREGNANCY, URINE  RPR  HIV ANTIBODY (ROUTINE TESTING)  I-STAT TROPOININ, ED  I-STAT TROPOININ, ED  GC/CHLAMYDIA PROBE AMP ([place]) NOT AT [facility]    EKG  EKG Interpretation  Date/Time:  Saturday September 18 2015 10:32:02 EDT Ventricular Rate:  70 PR Interval:  162 QRS Duration: 154 QT Interval:  450 QTC Calculation: 486 R Axis:   16 Text Interpretation:  Normal sinus rhythm Left bundle branch block Abnormal ECG no significant change since 2016 Confirmed by GOLDSTON MD, SCOTT [id number]) on 09/18/2015 10:50:57 AM       Radiology Dg Chest 2 View  Result Date: 09/18/2015 CLINICAL DATA:  Generalized chest pain x a couple of years, worsening x 2-3 days and SOB. Hx of heart cath in 04/2015, HTN. Non-smoker. EXAM: CHEST  2 VIEW COMPARISON:  04/16/2015 FINDINGS: Mild enlargement of the cardiac silhouette. No mediastinal or hilar masses or evidence of adenopathy. Clear lungs.  No pleural effusion or pneumothorax. Bony thorax is intact. IMPRESSION: No acute cardiopulmonary disease. Electronically Signed   By: Amie Portland M.D.   On: 09/18/2015 11:22   Ct Abdomen Pelvis W Contrast  Result Date: 09/18/2015 CLINICAL DATA:  Right lower quadrant pain and tenderness as well as swelling for 2 weeks. This has worsened over the last 2 days. EXAM: CT ABDOMEN AND PELVIS WITH CONTRAST TECHNIQUE: Multidetector CT imaging of the abdomen and pelvis was performed using the standard protocol following bolus administration of intravenous contrast. CONTRAST:  ISOVUE-300 IOPAMIDOL (ISOVUE-300) INJECTION 61% COMPARISON:  None. FINDINGS: Lower chest: Lung bases clear.  Mild cardiomegaly. Hepatobiliary: Normal liver. Gallbladder surgically absent. No bile duct dilation. Pancreas: Unremarkable.  No pancreatic ductal dilatation or surrounding inflammatory changes. Spleen: Normal in size without focal abnormality. Adrenals/Urinary Tract: Adrenal glands are unremarkable. Kidneys are normal, without renal calculi, focal lesion, or hydronephrosis. Bladder is unremarkable. Stomach/Bowel: Status post gastric stapling with formation of a gastrojejunostomy. No stomach wall thickening or inflammation. Small bowel anastomosis on the left mid abdomen with associated focal small bowel dilation. Small bowel otherwise unremarkable. There are numerous colonic diverticula. No diverticulitis. No other colonic abnormality. Normal appendix visualized Vascular/Lymphatic: No significant vascular findings are present. No enlarged abdominal or pelvic lymph nodes. Reproductive: Uterus and bilateral adnexa are unremarkable. Other: Anterior abdominal wall surgical scar and apparent hernia mesh. No hernia visualized. No ascites. Musculoskeletal: Degenerative changes most evident at L5-S1. No osteoblastic or osteolytic lesions. IMPRESSION: 1. No acute findings in the abdomen or pelvis. No findings to account for this patient's symptoms. 2. Normal appendix. 3. Numerous colonic diverticula.  No diverticulitis. 4. Status post cholecystectomy. Status post gastric stapling procedure with formation of a gastrojejunostomy. No bowel obstruction or inflammation. Electronically Signed   By: Amie Portland M.D.   On: 09/18/2015 15:51    Procedures Procedures (including critical care time)  Medications Ordered in ED Medications  iopamidol (ISOVUE-300) 61 % injection (100 mLs  Contrast Given 09/18/15 1451)  cefTRIAXone (ROCEPHIN) injection 250 mg (250 mg Intramuscular Given 09/18/15 1617)  azithromycin (ZITHROMAX) tablet 1,000 mg (1,000 mg Oral Given 09/18/15 1617)  lidocaine (PF) (XYLOCAINE) 1 % injection (1 mL  Given 09/18/15 1617)     Initial Impression / Assessment and Plan / ED Course  I have reviewed the triage vital signs and the  nursing notes.  Pertinent labs & imaging results that were available during my care of the patient were reviewed by me and considered in my medical decision making (see chart for details).  Clinical Course    PERC negative. CBC, BMP unremarkable. BNP 84.6.initial troponin 0.02. Delta pending. UA negative. Urine pregnancy negative.Wet prep shows present WBCs. GC/chlamydia, HIV, RPR sent. Patient wished to be treated in the ED prior to results. Rocephin and azithromycin given in the ED.CXR shows no active cardiopulmonary disease. CT abdomen pelvis negative for acute findings; numerous colonic diverticula, but no diverticulitis; normal appendix. I spoke with Dr. Royann Shivers who stated that he would not do anything differently, as the patient is on all the correct medications, and that the patient did not need to admitted to the hospital for further treatment at this time.e stated that he would expedite the process of the patient being seen in the office and he told me to tell the patient that she could call Monday around lunchtime if  she does not get a call Monday morning telling her of her appointment. Strict return precautions discussed. At shift change, patient care transferred to Melburn Hake, PA-C for continued evaluation, follow up of delta troponin and determination of disposition. Anticipate discharge if troponin negative.I discussed patient case with Dr. Criss Alvine who guided the patient's management and agrees with plan.    Final Clinical Impressions(s) / ED Diagnoses   Final diagnoses:  Chest pain, unspecified chest pain type  Shortness of breath    New Prescriptions New Prescriptions   No medications on file     Emi Holes, PA-C 09/18/15 1645    Pricilla Loveless, MD 09/20/15 [phone]

## 2015-09-18 NOTE — ED Notes (Signed)
Pt taken to CT.

## 2015-09-18 NOTE — ED Triage Notes (Signed)
Patient complains of chest heaviness with increased BP x 2 days. States that it is worse today. No radiation, no associated symptoms. Alert and oriented

## 2015-09-18 NOTE — Discharge Instructions (Signed)
Continue taking your home medications as prescribed. If you do not receive a call from your cardiologist's office listed above, call them tomorrow afternoon around lunchtime to set up an appointment for follow-up for this week. Please return to the Emergency Department if symptoms worsen or new onset of fever, lightheadedness, dizziness, difficulty breathing, abdominal pain, vomiting, numbness, tingling, weakness, syncope, leg swelling.

## 2015-09-18 NOTE — ED Notes (Signed)
Pt verbalized understanding of d/c instructions and has no further questions. Pt stable and NAD. Pt removed all belongings from room. Pt to follow up with cardiology

## 2015-09-18 NOTE — ED Notes (Signed)
Patient transported to CT 

## 2015-09-19 ENCOUNTER — Other Ambulatory Visit: Payer: Self-pay | Admitting: Physician Assistant

## 2015-09-19 LAB — HIV ANTIBODY (ROUTINE TESTING W REFLEX): HIV Screen 4th Generation wRfx: NONREACTIVE

## 2015-09-19 LAB — RPR: RPR Ser Ql: NONREACTIVE

## 2015-09-20 LAB — GC/CHLAMYDIA PROBE AMP (~~LOC~~) NOT AT ARMC
Chlamydia: NEGATIVE
Neisseria Gonorrhea: NEGATIVE

## 2015-09-21 ENCOUNTER — Telehealth: Payer: Self-pay

## 2015-09-21 NOTE — Telephone Encounter (Signed)
Called patient and made an appointment with [name] NP tomorrow at 8:30 am. Patient instructed to be at the office at 8:15 for check in. Patient verbalized understanding.

## 2015-09-21 NOTE — Telephone Encounter (Signed)
Left message for patient to call back  

## 2015-09-21 NOTE — Telephone Encounter (Signed)
-----   Message from Beatrice Lecher, [place] sent at 09/21/2015  8:11 AM EDT ----- Regarding: needs FU Please see below -  Dr. Rachelle Hora Croitoru asked for FU in CHF Clinic but she cannot get in this week. Please arrange FU this week with someone (someone on Dr. Fabio Bering team if possible).  If no availability, put her on with me. Tereso Newcomer, PA-C   09/21/2015 8:13 AM   ----- Message ----- From: Noralee Space, RN Sent: 09/20/2015  12:58 PM To: Beatrice Lecher, PA-C  I'll be happy to get her scheduled as a new pt in the future, but since work up was neg in ER, xray normal no edema and bnp was 84, I can't get her in this week.  I'll get her sch as new pt with [place] in a couple of weeks, if needs to be seen sooner should see PA or Dr Eden Emms, thanks ----- Message ----- From: Beatrice Lecher, PA-C Sent: 09/18/2015   4:02 PM To: Dolores Patty, MD, Laurey Morale, MD, #  Dr. Thurmon Fair was called about this patient. She was in ED 09/18/2015. She did not require admission. She has NICM with EF 25%. She needs an appointment in the CHF clinic this week. Please call to arrange. Thanks, [employer]

## 2015-09-22 ENCOUNTER — Ambulatory Visit (INDEPENDENT_AMBULATORY_CARE_PROVIDER_SITE_OTHER): Payer: BLUE CROSS/BLUE SHIELD | Admitting: Nurse Practitioner

## 2015-09-22 ENCOUNTER — Encounter: Payer: Self-pay | Admitting: Nurse Practitioner

## 2015-09-22 VITALS — BP 142/86 | HR 77 | Ht 61.0 in | Wt 210.0 lb

## 2015-09-22 DIAGNOSIS — R911 Solitary pulmonary nodule: Secondary | ICD-10-CM | POA: Diagnosis not present

## 2015-09-22 DIAGNOSIS — I10 Essential (primary) hypertension: Secondary | ICD-10-CM

## 2015-09-22 DIAGNOSIS — R0602 Shortness of breath: Secondary | ICD-10-CM

## 2015-09-22 DIAGNOSIS — I429 Cardiomyopathy, unspecified: Secondary | ICD-10-CM | POA: Diagnosis not present

## 2015-09-22 DIAGNOSIS — I428 Other cardiomyopathies: Secondary | ICD-10-CM

## 2015-09-22 LAB — BRAIN NATRIURETIC PEPTIDE: Brain Natriuretic Peptide: 38.3 pg/mL (ref <100)

## 2015-09-22 LAB — HEPATIC FUNCTION PANEL
ALT: 22 U/L (ref 6–29)
AST: 20 U/L (ref 10–35)
Albumin: 4.4 g/dL (ref 3.6–5.1)
Alkaline Phosphatase: 69 U/L (ref 33–115)
Bilirubin, Direct: 0 mg/dL (ref <=0.2)
Indirect Bilirubin: 0.3 mg/dL (ref 0.2–1.2)
Total Bilirubin: 0.3 mg/dL (ref 0.2–1.2)
Total Protein: 7.5 g/dL (ref 6.1–8.1)

## 2015-09-22 LAB — BASIC METABOLIC PANEL
BUN: 23 mg/dL (ref 7–25)
CO2: 21 mmol/L (ref 20–31)
Calcium: 9.7 mg/dL (ref 8.6–10.2)
Chloride: 106 mmol/L (ref 98–110)
Creat: 0.98 mg/dL (ref 0.50–1.10)
Glucose, Bld: 87 mg/dL (ref 65–99)
Potassium: 4.2 mmol/L (ref 3.5–5.3)
Sodium: 140 mmol/L (ref 135–146)

## 2015-09-22 LAB — TSH: TSH: 3.99 mIU/L

## 2015-09-22 MED ORDER — SPIRONOLACTONE 25 MG PO TABS: 12.5000 mg | ORAL_TABLET | Freq: Every day | ORAL | 3 refills | Status: DC

## 2015-09-22 MED ORDER — LISINOPRIL 2.5 MG PO TABS: 2.5000 mg | ORAL_TABLET | Freq: Every day | ORAL | 6 refills | Status: DC

## 2015-09-22 NOTE — Progress Notes (Signed)
CARDIOLOGY OFFICE NOTE  Date:  09/22/2015    Jenna Beltran Date of Birth: Jan 08, 1965 Medical Record #[id number]  PCP:  No PCP Per Patient  Cardiologist:  Eden Emms  Chief Complaint  Patient presents with  . Congestive Heart Failure    Post ER visit - seen for Dr. Eden Emms    History of Present Illness: Jenna Beltran is a 50 y.o. female who presents today for a post ER visit. Seen for Dr. Eden Emms.   She has a history of gastric bypass surgery, obesity, LBBB, chronic systolic CHF (EF 74-94%), HTN, non compliance and pulmonary nodules.   She was last seen here back in March. She had had an echo back in 2016 with decreased EF - referred on for cardiac cath - see below.  Last chest CT was from 05/2014 - looks like repeat study not done.   In the ER this past weekend with chest heaviness, dyspnea and possible STD exposure. Had a negative CT of the abdomen/pelvis. BNP totally normal. Apparently has been referred to the CHF clinic - but unable to get in for a few more weeks.   Thus added to my schedule for today.    Comes in today. Here alone. She feels like she "is full of fluid". Weight is up about 10 pounds. She notes she feels better around 195#. Does not sound like she gets too much salt. Rarely eats out. She is not taking the Lisinopril due to dizziness - she stands all day cutting hair. She is not aware of her prior chest CT. She has insurance. She is able to afford her medicines. She notes a lot of fatigue. She tells me - no one has ever really told her what's wrong with her.   Past Medical History:  Diagnosis Date  . H/O cardiac catheterization 04/2015   No coronary disease  . HTN (hypertension)   . Hypertension   . NICM (nonischemic cardiomyopathy) [facility])     Past Surgical History:  Procedure Laterality Date  . BREAST SURGERY    . CARDIAC CATHETERIZATION N/A 04/06/2015   Procedure: Right/Left Heart Cath and Coronary Angiography;  Surgeon: Wendall Stade, MD;  Location: [facility]  INVASIVE CV LAB;  Service: Cardiovascular;  Laterality: N/A;  . GASTRIC BYPASS    . tummy tuck       Medications: Current Outpatient Prescriptions  Medication Sig Dispense Refill  . carvedilol (COREG) 6.25 MG tablet Take 1 tablet (6.25 mg total) by mouth 2 (two) times daily. 180 tablet 3  . furosemide (LASIX) 20 MG tablet Take 20 mg by mouth daily.    Marland Kitchen lisinopril (PRINIVIL,ZESTRIL) 2.5 MG tablet Take 1 tablet (2.5 mg total) by mouth daily. 30 tablet 6  . spironolactone (ALDACTONE) 25 MG tablet Take 0.5 tablets (12.5 mg total) by mouth daily. 90 tablet 3   No current facility-administered medications for this visit.     Allergies: No Known Allergies  Social History: The patient  reports that she has never smoked. She has never used smokeless tobacco. She reports that she drinks alcohol. She reports that she does not use drugs.   Family History: The patient's family history is not on file.   Review of Systems: Please see the history of present illness.   Otherwise, the review of systems is positive for none.   All other systems are reviewed and negative.   Physical Exam: VS:  BP (!) 142/86   Pulse 77   Ht 5\' 1"  (1.549 m)  Wt 210 lb (95.3 kg)   SpO2 95% Comment: at rest  BMI 39.68 kg/m  .  BMI Body mass index is 39.68 kg/m.  Wt Readings from Last 3 Encounters:  09/22/15 210 lb (95.3 kg)  09/18/15 195 lb (88.5 kg)  08/24/15 195 lb (88.5 kg)    General: Pleasant. Obese female who is alert and in no acute distress.  HEENT: Normal.  Neck: Supple, no JVD, carotid bruits, or masses noted.  Cardiac: Regular rate and rhythm. She may have an S3. Heart tones are distant. Legs seem full. Respiratory:  Lungs are clear to auscultation bilaterally with normal work of breathing.  GI: Obese but soft and nontender.  MS: No deformity or atrophy. Gait and ROM intact.  Skin: Warm and dry. Color is normal.  Neuro:  Strength and sensation are intact and no gross focal deficits noted.    Psych: Alert, appropriate and with normal affect.   LABORATORY DATA:  EKG:  EKG is not ordered today.  Lab Results  Component Value Date   WBC 5.0 09/18/2015   HGB 14.3 09/18/2015   HCT 43.5 09/18/2015   PLT 210 09/18/2015   GLUCOSE 110 (H) 09/18/2015   ALT 32 11/17/2013   AST 25 11/17/2013   NA 138 09/18/2015   K 4.2 09/18/2015   CL 104 09/18/2015   CREATININE 0.87 09/18/2015   BUN 23 (H) 09/18/2015   CO2 24 09/18/2015   INR 0.95 04/02/2015    BNP (last 3 results)  Recent Labs  01/02/15 1710 09/18/15 1030  BNP 624.1* 84.6    ProBNP (last 3 results) No results for input(s): PROBNP in the last 8760 hours.   Other Studies Reviewed Today:   Right/Left Heart Cath and Coronary Angiography 04/2015  Conclusion   Normal right dominant coronary arteries Normal right heart pressures Non ischemic DCM EF 25-30% by echo   Echo Study Conclusions from 06/2014  - Left ventricle: The cavity size was moderately dilated. Wall   thickness was increased in a pattern of mild LVH. Systolic   function was severely reduced. The estimated ejection fraction   was in the range of 25% to 30%. There is akinesis of the   anteroseptal myocardium. Doppler parameters are consistent with   abnormal left ventricular relaxation (grade 1 diastolic   dysfunction). Doppler parameters are consistent with high   ventricular filling pressure. - Ventricular septum: Septal motion showed abnormal function and   dyssynergy. - Left atrium: The atrium was mildly dilated.   Assessment/Plan: 1. NICM - has EF of 20 to 25% that dates back over one year - we have discussed this diagnosis in detail today. Noted to have dyssynergy on echo. Needs medicines optimized - needs referral to EP to discuss possible device - has LBBB - may benefit from CRT therapy.  She is not able to tolerate the high dose of ACE - so she has not been taking - will restart at 2.5 mg a day - she will take at night. Will add low  dose Aldactone. Stop potassium. Stop NSAID. Restrict salt and daily weights. I will see her back in one week.   2. Chronic LV systolic HF - see #1  3. Normal coronaries per cardiac cath from 04/2015  4. Non compliance - I don't really get this impression - I think she just has intolerance to high dose ACE.   5. HTN - adding aldactone today.   6. Lung nodules - needs her chest CT updated - we  talked about this today - will arrange on return.   Current medicines are reviewed with the patient today.  The patient does not have concerns regarding medicines other than what has been noted above.  The following changes have been made:  See above.  Labs/ tests ordered today include:    Orders Placed This Encounter  Procedures  . Basic metabolic panel  . Brain natriuretic peptide  . TSH  . Hepatic function panel     Disposition:   FU with me in one week - seeing Dr. Gala RomneyBensimhon in early October.     Patient is agreeable to this plan and will call if any problems develop in the interim.   Signed: Rosalio MacadamiaLori C. Alga Southall, RN, ANP-C 09/22/2015 9:02 AM  [facility] [address] [place], [place]  [address] Phone: [phone] Fax: [phone]

## 2015-09-22 NOTE — Patient Instructions (Addendum)
We will be checking the following labs today - BMET, BNP, TSH and HPF   Medication Instructions:    Continue with your current medicines. BUT  STOP Ibuprofen, Advil, Aleve - use Tylenol for pain  STOP potassium  I am cutting your dose of Lisinopril back to 2.5 mg - take each night - this has been sent to your drug store  START Aldactone 25 mg - take just 1/2 a tablet daily - in the AM - this has been sent to your drug store  Extra dose of Lasix for the next 2 days - then back to just one daily     Testing/Procedures To Be Arranged:  N/A  Follow-Up:   See me in one week    Other Special Instructions:   Daily weights  Restrict salt - goal is less than 1500 mg a day    If you need a refill on your cardiac medications before your next appointment, please call your pharmacy.   Call the [facility] office at [phone] if you have any questions, problems or concerns.

## 2015-09-28 ENCOUNTER — Ambulatory Visit (INDEPENDENT_AMBULATORY_CARE_PROVIDER_SITE_OTHER): Payer: BLUE CROSS/BLUE SHIELD | Admitting: Nurse Practitioner

## 2015-09-28 ENCOUNTER — Encounter: Payer: Self-pay | Admitting: Nurse Practitioner

## 2015-09-28 VITALS — BP 136/90 | HR 71 | Ht 61.0 in | Wt 208.0 lb

## 2015-09-28 DIAGNOSIS — R911 Solitary pulmonary nodule: Secondary | ICD-10-CM | POA: Diagnosis not present

## 2015-09-28 DIAGNOSIS — I429 Cardiomyopathy, unspecified: Secondary | ICD-10-CM | POA: Diagnosis not present

## 2015-09-28 DIAGNOSIS — I10 Essential (primary) hypertension: Secondary | ICD-10-CM

## 2015-09-28 DIAGNOSIS — I428 Other cardiomyopathies: Secondary | ICD-10-CM

## 2015-09-28 DIAGNOSIS — R0602 Shortness of breath: Secondary | ICD-10-CM

## 2015-09-28 LAB — BASIC METABOLIC PANEL
BUN: 27 mg/dL — ABNORMAL HIGH (ref 7–25)
CO2: 24 mmol/L (ref 20–31)
Calcium: 9.8 mg/dL (ref 8.6–10.2)
Chloride: 102 mmol/L (ref 98–110)
Creat: 1.09 mg/dL (ref 0.50–1.10)
Glucose, Bld: 106 mg/dL — ABNORMAL HIGH (ref 65–99)
Potassium: 4.2 mmol/L (ref 3.5–5.3)
Sodium: 137 mmol/L (ref 135–146)

## 2015-09-28 LAB — BRAIN NATRIURETIC PEPTIDE: Brain Natriuretic Peptide: 25.4 pg/mL (ref <100)

## 2015-09-28 MED ORDER — SPIRONOLACTONE 25 MG PO TABS: 25.0000 mg | ORAL_TABLET | Freq: Every day | ORAL | 3 refills | Status: DC

## 2015-09-28 NOTE — Progress Notes (Signed)
CARDIOLOGY OFFICE NOTE  Date:  09/28/2015    Jenna Beltran Date of Birth: 1965-11-11 Medical Record #[id number]  PCP:  No PCP Per Patient  Cardiologist:  [facility]  Chief Complaint  Patient presents with  . Congestive Heart Failure    1 week check - seen for Dr. Eden Emms    History of Present Illness: Jenna Beltran is a 50 y.o. female who presents today for an approximate 1 week check. Seen for Dr. Eden Emms.   She has a history of gastric bypass surgery, obesity, LBBB, chronic systolic CHF (EF 67-70%), HTN, non compliance and pulmonary nodules.   She was last seen here back in March. She had had an echo back in 2016 with decreased EF - referred on for cardiac cath - see below.  Last chest CT was from 05/2014 - looks like repeat study not done.   In the ER 2 weekends ago with chest heaviness, dyspnea and possible STD exposure. Had a negative CT of the abdomen/pelvis. BNP totally normal. Apparently has been referred to the CHF clinic - but unable to get in for a few more weeks.   I therefore saw her back - she was felt to be volume overloaded - but BNP totally normal - was not taking Lisinopril due to side effects - got her medicines changed around. Discussed her diagnosis and plan of care in depth with her. Need to consider EP referral - has dyssynergy on her echo with known LBBB.   Comes in today. Here with her friend today - [place]. She feels much better. Weight is down 2 pounds. She notes she is not as short of breath. She took the whole dose of Aldactone instead of half. Less swelling. No chest pain. Not dizzy. Able to take very low dose ACE at night. Feels much better overall than she was last week. She is going to the CHF clinic next week.   Past Medical History:  Diagnosis Date  . H/O cardiac catheterization 04/2015   No coronary disease  . HTN (hypertension)   . Hypertension   . NICM (nonischemic cardiomyopathy) [facility])     Past Surgical History:  Procedure  Laterality Date  . BREAST SURGERY    . CARDIAC CATHETERIZATION N/A 04/06/2015   Procedure: Right/Left Heart Cath and Coronary Angiography;  Surgeon: Wendall Stade, MD;  Location: [facility] INVASIVE CV LAB;  Service: Cardiovascular;  Laterality: N/A;  . GASTRIC BYPASS    . tummy tuck       Medications: Current Outpatient Prescriptions  Medication Sig Dispense Refill  . carvedilol (COREG) 6.25 MG tablet Take 1 tablet (6.25 mg total) by mouth 2 (two) times daily. 180 tablet 3  . furosemide (LASIX) 20 MG tablet Take 20 mg by mouth daily.    Marland Kitchen lisinopril (PRINIVIL,ZESTRIL) 2.5 MG tablet Take 1 tablet (2.5 mg total) by mouth daily. 30 tablet 6  . spironolactone (ALDACTONE) 25 MG tablet Take 1 tablet (25 mg total) by mouth daily. 90 tablet 3   No current facility-administered medications for this visit.     Allergies: No Known Allergies  Social History: The patient  reports that she has never smoked. She has never used smokeless tobacco. She reports that she drinks alcohol. She reports that she does not use drugs.   Family History: The patient's family history is not on file.   Review of Systems: Please see the history of present illness.   Otherwise, the review of systems is positive for none.  All other systems are reviewed and negative.   Physical Exam: VS:  BP 136/90   Pulse 71   Ht 5\' 1"  (1.549 m)   Wt 208 lb (94.3 kg)   SpO2 98%   BMI 39.30 kg/m  .  BMI Body mass index is 39.3 kg/m.  Wt Readings from Last 3 Encounters:  09/28/15 208 lb (94.3 kg)  09/22/15 210 lb (95.3 kg)  09/18/15 195 lb (88.5 kg)    General: Pleasant. She remains obese. Weight is down 2 pounds. She is alert and in no acute distress.   HEENT: Normal.  Neck: Supple, no JVD, carotid bruits, or masses noted.  Cardiac: Regular rate and rhythm. No S3 noted today.  Less edema.  Respiratory:  Lungs are clear to auscultation bilaterally with normal work of breathing.  GI: Soft and nontender.  MS: No deformity  or atrophy. Gait and ROM intact.  Skin: Warm and dry. Color is normal.  Neuro:  Strength and sensation are intact and no gross focal deficits noted.  Psych: Alert, appropriate and with normal affect.   LABORATORY DATA:  EKG:  EKG is not ordered today.  EKG from earlier this month with NSR with LBBB - QRS .15 noted.   Lab Results  Component Value Date   WBC 5.0 09/18/2015   HGB 14.3 09/18/2015   HCT 43.5 09/18/2015   PLT 210 09/18/2015   GLUCOSE 87 09/22/2015   ALT 22 09/22/2015   AST 20 09/22/2015   NA 140 09/22/2015   K 4.2 09/22/2015   CL 106 09/22/2015   CREATININE 0.98 09/22/2015   BUN 23 09/22/2015   CO2 21 09/22/2015   TSH 3.99 09/22/2015   INR 0.95 04/02/2015    BNP (last 3 results)  Recent Labs  01/02/15 1710 09/18/15 1030 09/22/15 0905  BNP 624.1* 84.6 38.3    ProBNP (last 3 results) No results for input(s): PROBNP in the last 8760 hours.   Other Studies Reviewed Today:  Right/Left Heart Cath and Coronary Angiography 04/2015  Conclusion   Normal right dominant coronary arteries Normal right heart pressures Non ischemic DCM EF 25-30% by echo   Echo Study Conclusions from 06/2014  - Left ventricle: The cavity size was moderately dilated. Wall thickness was increased in a pattern of mild LVH. Systolic function was severely reduced. The estimated ejection fraction was in the range of 25% to 30%. There is akinesis of the anteroseptal myocardium. Doppler parameters are consistent with abnormal left ventricular relaxation (grade 1 diastolic dysfunction). Doppler parameters are consistent with high ventricular filling pressure. - Ventricular septum: Septal motion showed abnormal function and dyssynergy. - Left atrium: The atrium was mildly dilated.  CT IMPRESSION FROM 05/2014: 1. Pulmonary artery enlargement suggests pulmonary arterial hypertension. The right hilar soft tissue fullness was likely related to an enlarged right  pulmonary artery. No hilar adenopathy or mass. 2. Mild cardiomegaly. 3. Perifissural pulmonary nodules may represent subpleural lymph nodes. The largest measures 6 mm. If the patient is at high risk for bronchogenic carcinoma, follow-up chest CT at 6-12 months is recommended. If the patient is at low risk for bronchogenic carcinoma, follow-up chest CT at 12 months is recommended. This recommendation follows the consensus statement: "Guidelines for Management of Small Pulmonary Nodules Detected on CT Scans: A Statement from the Fleischner Society" as published in Radiology2005;237:395-400. Online at: DietDisorder.czhttp://www.med.[employer].edu/rad/res/Fleischner-nodule.htm.   Electronically Signed   By: Jeronimo GreavesKyle  Talbot M.D.   On: 05/22/2014 09:48    Assessment/Plan: 1. NICM - has EF  of 20 to 25% that dates back over one year - trying to optimizing her medicines - will talk with EP to discuss possible device - has LBBB - and may benefit from CRT therapy.  She was not able to tolerate the high dose of ACE - so she had not been taking - restarted at 2.5 mg a day last week - feels better. Also now on Aldactone. NSAID and potassium stopped last week. Recheck her lab - if BUN/creatinine ok - will increase her ACE by phone.   2. Chronic LV systolic HF - see #1  3. Normal coronaries per cardiac cath from 04/2015  4. Non compliance - I don't really get this impression - I think she just had intolerance to high dose ACE.   5. HTN - added aldactone last week. Rechecking lab today and hope to increase her ACE further.   6. Obesity  7. Lung nodule - needs chest CT updated. Will arrange.    Current medicines are reviewed with the patient today.  The patient does not have concerns regarding medicines other than what has been noted above.  The following changes have been made:  See above.  Labs/ tests ordered today include:    Orders Placed This Encounter  Procedures  . CT CHEST WO CONTRAST  . Basic  metabolic panel  . Brain natriuretic peptide     Disposition:   FU with Dr. Gala Romney next week - I will be happy to see back as needed for her management.   Patient is agreeable to this plan and will call if any problems develop in the interim.   Signed: Rosalio Macadamia, RN, ANP-C 09/28/2015 10:10 AM  [facility] [address] [place], [place]  [address] Phone: [phone] Fax: [phone]

## 2015-09-28 NOTE — Patient Instructions (Addendum)
We will be checking the following labs today - BMET & BNP   Medication Instructions:    Continue with your current medicines.   Stay on your current medicines for now - I may call you and increase the Lisinopril - but wait for [place] to call you    Testing/Procedures To Be Arranged:  Chest CT to follow up lung nodule  Follow-Up:   See Dr. Gala Romney next week as planned - I will be happy to see you back after that as needed.     Other Special Instructions:   Keep restricting your salt  Daily weights  I will talk with Dr. Graciela Husbands (an EP doctor) about seeing you for a possible ICD/pacemaker    If you need a refill on your cardiac medications before your next appointment, please call your pharmacy.   Call the [facility] office at [phone] if you have any questions, problems or concerns.

## 2015-09-30 ENCOUNTER — Inpatient Hospital Stay: Admission: RE | Admit: 2015-09-30 | Payer: BLUE CROSS/BLUE SHIELD | Source: Ambulatory Visit

## 2015-10-01 ENCOUNTER — Other Ambulatory Visit: Payer: Self-pay | Admitting: Nurse Practitioner

## 2015-10-01 ENCOUNTER — Telehealth: Payer: Self-pay | Admitting: *Deleted

## 2015-10-01 DIAGNOSIS — I428 Other cardiomyopathies: Secondary | ICD-10-CM

## 2015-10-01 DIAGNOSIS — I447 Left bundle-branch block, unspecified: Secondary | ICD-10-CM

## 2015-10-01 NOTE — Telephone Encounter (Signed)
-----   Message from Rosalio Macadamia, NP sent at 10/01/2015  8:39 AM EDT ----- Please call and let her know - I have talked with Dr. Graciela Husbands who says she is most definitively a candidate for a device. I have put in the referral - needs appointment.   Lawson Fiscal

## 2015-10-01 NOTE — Telephone Encounter (Signed)
lvm explaining message.

## 2015-10-07 ENCOUNTER — Encounter (HOSPITAL_COMMUNITY): Payer: BLUE CROSS/BLUE SHIELD | Admitting: Internal Medicine

## 2015-10-13 ENCOUNTER — Encounter: Payer: Self-pay | Admitting: Internal Medicine

## 2015-10-13 ENCOUNTER — Ambulatory Visit (INDEPENDENT_AMBULATORY_CARE_PROVIDER_SITE_OTHER): Payer: BLUE CROSS/BLUE SHIELD | Admitting: Internal Medicine

## 2015-10-13 VITALS — BP 150/90 | HR 63 | Ht 61.0 in | Wt 211.0 lb

## 2015-10-13 DIAGNOSIS — I428 Other cardiomyopathies: Secondary | ICD-10-CM | POA: Diagnosis not present

## 2015-10-13 DIAGNOSIS — I5022 Chronic systolic (congestive) heart failure: Secondary | ICD-10-CM

## 2015-10-13 NOTE — Progress Notes (Signed)
ELECTROPHYSIOLOGY CONSULT NOTE  Patient ID: Jenna Beltran, MRN: [id number], DOB/AGE: Feb 25, 1965 50 y.o. Admit date: (Not on file) Date of Consult: 10/13/2015  Primary Physician: No PCP Per Patient Primary Cardiologist: TT Consulting Physician TT e.  Chief Complaint: ICD consideraton    HPI Jenna Beltran is a 50 y.o. female  Referred for consideration of CRT. She has a history of a nonischemic cardiomyopathy with catheterization 4/17 demonstrating normal coronary arteries echo 6/16 demonstrated EF 25%. She has struggled SHE was originally diagnosed a couple of years ago as above. She has been variably compliant with medications.  She is able to climb stairs but has difficulty standing for prolonged periods of time; she complains of fatigue and has sleep disordered breathing but not significant daytime somnolence.   she has not had tachycardia palpitations or syncope.She has had dizziness with her medications and has done better with taking her lisinopril at night and   to tolerate her carvedilol twice a day and her Aldacton   her past medical history is notable for morbid obesity with prior gastric bypass surgery,     Past Medical History:  Diagnosis Date  . H/O cardiac catheterization 04/2015   No coronary disease  . HTN (hypertension)   . Hypertension   . NICM (nonischemic cardiomyopathy) [facility])       Surgical History:  Past Surgical History:  Procedure Laterality Date  . BREAST SURGERY    . CARDIAC CATHETERIZATION N/A 04/06/2015   Procedure: Right/Left Heart Cath and Coronary Angiography;  Surgeon: Wendall Stade, MD;  Location: [facility] INVASIVE CV LAB;  Service: Cardiovascular;  Laterality: N/A;  . GASTRIC BYPASS    . tummy tuck       Home Meds: Prior to Admission medications   Medication Sig Start Date End Date Taking? Authorizing Provider  carvedilol (COREG) 6.25 MG tablet Take 1 tablet (6.25 mg total) by mouth 2 (two) times daily. 07/22/15  Yes Wendall Stade, MD    furosemide (LASIX) 20 MG tablet Take 20 mg by mouth daily.   Yes Historical Provider, MD  lisinopril (PRINIVIL,ZESTRIL) 2.5 MG tablet Take 1 tablet (2.5 mg total) by mouth daily. 09/22/15 12/21/15 Yes Rosalio Macadamia, NP  spironolactone (ALDACTONE) 25 MG tablet Take 1 tablet (25 mg total) by mouth daily. 09/28/15 12/27/15 Yes Rosalio Macadamia, NP    Allergies: No Known Allergies  Social History   Social History  . Marital status: Divorced    Spouse name: N/A  . Number of children: 3  . Years of education: college   Occupational History  . [employer]    Social History Main Topics  . Smoking status: Never Smoker  . Smokeless tobacco: Never Used  . Alcohol use Yes  . Drug use: No  . Sexual activity: Not on file   Other Topics Concern  . Not on file   Social History Narrative  . No narrative on file     History reviewed. No pertinent family history.   ROS:  Please see the history of present illness.     All other systems reviewed and negative.    Physical Exam: Blood pressure (!) 150/90, pulse 63, height 5\' 1"  (1.549 m), weight 211 lb (95.7 kg), SpO2 98 %. General: Well developed, well nourished female in no acute distress. Head: Normocephalic, atraumatic, sclera non-icteric, no xanthomas, nares are without discharge. EENT: normal  Lymph Nodes:  none Neck: Negative for carotid bruits. JVD not elevated. Back:without scoliosis kyphosis Lungs: Clear  bilaterally to auscultation without wheezes, rales, or rhonchi. Breathing is unlabored. Heart: RRR with S1 S2. 2/6 murmur . No rubs, or gallops appreciated. Abdomen: Soft, non-tender, non-distended with normoactive bowel sounds. No hepatomegaly. No rebound/guarding. No obvious abdominal masses. Msk:  Strength and tone appear normal for age. Extremities: No clubbing or cyanosis. No edema.  Distal pedal pulses are 2+ and equal bilaterally. Skin: Warm and Dry Neuro: Alert and oriented X 3. CN III-XII intact Grossly normal sensory  and motor function . Psych:  Responds to questions appropriately with a normal affect.      Labs: Cardiac Enzymes No results for input(s): CKTOTAL, CKMB, TROPONINI in the last 72 hours. CBC Lab Results  Component Value Date   WBC 5.0 09/18/2015   HGB 14.3 09/18/2015   HCT 43.5 09/18/2015   MCV 98.0 09/18/2015   PLT 210 09/18/2015   PROTIME: No results for input(s): LABPROT, INR in the last 72 hours. Chemistry No results for input(s): NA, K, CL, CO2, BUN, CREATININE, CALCIUM, PROT, BILITOT, ALKPHOS, ALT, AST, GLUCOSE in the last 168 hours.  Invalid input(s): LABALBU Lipids No results found for: CHOL, HDL, LDLCALC, TRIG BNP Pro B Natriuretic peptide (BNP)  Date/Time Value Ref Range Status  09/06/2013 09:30 AM 1,343.0 (H) 0 - 125 pg/mL Final   Thyroid Function Tests: No results for input(s): TSH, T4TOTAL, T3FREE, THYROIDAB in the last 72 hours.  Invalid input(s): FREET3 Miscellaneous No results found for: DDIMER  Radiology/Studies:  Dg Chest 2 View  Result Date: 09/18/2015 CLINICAL DATA:  Generalized chest pain x a couple of years, worsening x 2-3 days and SOB. Hx of heart cath in 04/2015, HTN. Non-smoker. EXAM: CHEST  2 VIEW COMPARISON:  04/16/2015 FINDINGS: Mild enlargement of the cardiac silhouette. No mediastinal or hilar masses or evidence of adenopathy. Clear lungs.  No pleural effusion or pneumothorax. Bony thorax is intact. IMPRESSION: No acute cardiopulmonary disease. Electronically Signed   By: Amie Portlandavid  Ormond M.D.   On: 09/18/2015 11:22   Ct Abdomen Pelvis W Contrast  Result Date: 09/18/2015 CLINICAL DATA:  Right lower quadrant pain and tenderness as well as swelling for 2 weeks. This has worsened over the last 2 days. EXAM: CT ABDOMEN AND PELVIS WITH CONTRAST TECHNIQUE: Multidetector CT imaging of the abdomen and pelvis was performed using the standard protocol following bolus administration of intravenous contrast. CONTRAST:  100mL ISOVUE-300 IOPAMIDOL (ISOVUE-300)  INJECTION 61% COMPARISON:  None. FINDINGS: Lower chest: Lung bases clear.  Mild cardiomegaly. Hepatobiliary: Normal liver. Gallbladder surgically absent. No bile duct dilation. Pancreas: Unremarkable. No pancreatic ductal dilatation or surrounding inflammatory changes. Spleen: Normal in size without focal abnormality. Adrenals/Urinary Tract: Adrenal glands are unremarkable. Kidneys are normal, without renal calculi, focal lesion, or hydronephrosis. Bladder is unremarkable. Stomach/Bowel: Status post gastric stapling with formation of a gastrojejunostomy. No stomach wall thickening or inflammation. Small bowel anastomosis on the left mid abdomen with associated focal small bowel dilation. Small bowel otherwise unremarkable. There are numerous colonic diverticula. No diverticulitis. No other colonic abnormality. Normal appendix visualized Vascular/Lymphatic: No significant vascular findings are present. No enlarged abdominal or pelvic lymph nodes. Reproductive: Uterus and bilateral adnexa are unremarkable. Other: Anterior abdominal wall surgical scar and apparent hernia mesh. No hernia visualized. No ascites. Musculoskeletal: Degenerative changes most evident at L5-S1. No osteoblastic or osteolytic lesions. IMPRESSION: 1. No acute findings in the abdomen or pelvis. No findings to account for this patient's symptoms. 2. Normal appendix. 3. Numerous colonic diverticula.  No diverticulitis. 4. Status post cholecystectomy. Status post  gastric stapling procedure with formation of a gastrojejunostomy. No bowel obstruction or inflammation. Electronically Signed   By: Amie Portland M.D.   On: 09/18/2015 15:51    EKG:  Sinus rhythm with left bundle branch block  QRS duration 155 ms    Assessment and Plan:   Nonischemic cardio myopathy? Ejection fraction    left bundle branch block  Congestive heart failure class II  Sleep disordered breathing  Fatigue      The patient has nonischemic cardiomyopathy;  unfortunately, her last ejection fraction assessment was 16 months ago. Prior to making a decision regarding device implantation, it would be necessary to reassess LV function. In the event that he is greater than 35-40%, CPX may be useful to try to determine whether her functional status might justify resynchronization. Otherwise, there be no indication for device implantation. In the event that it remains persistently low, CRT-D would be reasonable. We will plan to discuss this with her following echocardiogram.  Given her fatigue, I also think she would benefit from a sleep study. Sherryl Manges

## 2015-10-13 NOTE — Patient Instructions (Signed)
Medication Instructions: Your physician recommends that you continue on your current medications as directed. Please refer to the Current Medication list given to you today.   Labwork: None Ordered  Procedures/Testing: Your physician has requested that you have an echocardiogram. Echocardiography is a painless test that uses sound waves to create images of your heart. It provides your doctor with information about the size and shape of your heart and how well your heart's chambers and valves are working. This procedure takes approximately one hour. There are no restrictions for this procedure.    Follow-Up: Your physician recommends that you schedule a follow-up appointment pending the results of the echocardiogram.   Any Additional Special Instructions Will Be Listed Below (If Applicable).     If you need a refill on your cardiac medications before your next appointment, please call your pharmacy.

## 2015-10-27 ENCOUNTER — Other Ambulatory Visit: Payer: Self-pay | Admitting: *Deleted

## 2015-10-27 ENCOUNTER — Telehealth: Payer: Self-pay | Admitting: Nurse Practitioner

## 2015-10-27 DIAGNOSIS — R0602 Shortness of breath: Secondary | ICD-10-CM

## 2015-10-27 DIAGNOSIS — I428 Other cardiomyopathies: Secondary | ICD-10-CM

## 2015-10-27 MED ORDER — SPIRONOLACTONE 25 MG PO TABS: 12.5000 mg | ORAL_TABLET | Freq: Every day | ORAL | 3 refills | Status: AC

## 2015-10-27 NOTE — Telephone Encounter (Signed)
New Message  Pt call requesting to speak with Jenna Beltran about her echo. Please call back to discuss

## 2015-10-27 NOTE — Telephone Encounter (Signed)
S/w pt stated aldactone (25 mg ) daily is making pt feel dizzy, feels like passing out,  Dropping stuff, acid reflux, abdomen hurts, yeast infections.  T/w [name], NP, pt will cut medication in half.  Pt will now take (12.5 mg ) daily.  Updated medication list. Pt is agreeable to plan.

## 2015-10-28 ENCOUNTER — Other Ambulatory Visit: Payer: Self-pay

## 2015-10-28 ENCOUNTER — Ambulatory Visit (HOSPITAL_COMMUNITY): Payer: BLUE CROSS/BLUE SHIELD | Attending: Cardiology

## 2015-10-28 DIAGNOSIS — I5022 Chronic systolic (congestive) heart failure: Secondary | ICD-10-CM | POA: Insufficient documentation

## 2015-10-28 DIAGNOSIS — I428 Other cardiomyopathies: Secondary | ICD-10-CM | POA: Diagnosis present

## 2015-10-30 ENCOUNTER — Other Ambulatory Visit: Payer: Self-pay | Admitting: Physician Assistant

## 2015-11-02 ENCOUNTER — Telehealth: Payer: Self-pay

## 2015-11-02 ENCOUNTER — Telehealth: Payer: Self-pay | Admitting: Internal Medicine

## 2015-11-02 NOTE — Telephone Encounter (Signed)
New Message ° °Pt voiced returning nurses call. ° °Please f/u °

## 2015-11-02 NOTE — Telephone Encounter (Signed)
Left message for patient to call back for ECHO results

## 2015-11-02 NOTE — Telephone Encounter (Signed)
Pt is aware of normal results. Pt is not agreeable to results. She states that even though her EF may be better she still feels lousy if not worse. Pt states that she is feeling very SOB and having some chest pain and tightness. She states that she hasn't taken her fluid pill (Lasix 20 Mg QD) in a week due to her RX running out and being expired. I asked her does she know how much fluid she's retaining and has she been keeping track of her weight. She states she does not weigh herself but she cannot fit into her clothes and she can see her swelling. She also states that she is very fatigued. I told her that we may be able to send her a new RX for her Lasix but that she may want to come in and be seen by Dr. Graciela Husbands. She was very open to the idea of having an OV and asked if it could be scheduled this week. I told her I would have to check with Melissa our scheduler and with Dr. Graciela Husbands to se what he recommends. I tod her once I got with them I would give her a call back. She was agreeable.

## 2015-11-03 ENCOUNTER — Telehealth: Payer: Self-pay | Admitting: Internal Medicine

## 2015-11-03 NOTE — Telephone Encounter (Signed)
Follow Up:    Pt said she waiting to find out about appointment today at 11:00. Please call asap.

## 2015-11-04 ENCOUNTER — Other Ambulatory Visit: Payer: Self-pay

## 2015-11-04 DIAGNOSIS — R911 Solitary pulmonary nodule: Secondary | ICD-10-CM

## 2015-11-04 DIAGNOSIS — R0602 Shortness of breath: Secondary | ICD-10-CM

## 2015-11-04 NOTE — Telephone Encounter (Signed)
Notes Recorded by Rebbeca Paul, CMA on 11/02/2015 at 12:36 PM EDT Jenna Beltran is aware of normal results. Jenna Beltran is not agreeable to results. She states that even though her EF may be better she still feels lousy if not worse. Jenna Beltran states that she is feeling very SOB and having some chest pain and tightness. She states that she hasn't taken her fluid pill (Lasix 20 Mg QD) in a week due to her RX running out and being expired. I asked her does she know how much fluid she's retaining and has she been keeping track of her weight. She states she does not weigh herself but she cannot fit into her clothes and she can see her swelling. She also states that she is very fatigued. I told her that we may be able to send her a new RX for her Lasix but that she may want to come in and be seen by Dr. Graciela Husbands. She was very open to the idea of having an OV and asked if it could be scheduled this week. I told her I would have to check with Melissa our scheduler and with Dr. Graciela Husbands to se what he recommends. I tod her once I got with them I would give her a call back. She was agreeable. ------

## 2015-11-04 NOTE — Telephone Encounter (Signed)
I reviewed the patient's discussion with Dellis Filbert, CMA from 10/31 with Dr. Graciela Husbands. Per his recommendation, she should resume her lasix- no need for EP follow up due EF 45-50%. She will need follow up with [name], NP or Dr. Eden Emms.  I called and spoke with the patient. She states she did find that she had refills on her lasix 20 mg tablets. She got this refilled today, after being off of it for a week. She took two tablets (40 mg) today and is feeling some better. She does not have a scale to weigh at home. I have advised her that she should obtain a scale, how, and when to weigh. She should record her readings as her baseline weight is unclear to her. I have advised her to take lasix 40 mg tomorrow and Saturday, then resume 20 mg once daily. She would prefer to see Lawson Fiscal, NP back. I have scheduled her a follow up for 11/09/15. She needs to check her work schedule, but will call back if this does not work. She has been advised to continue all of her medications as prescribed.

## 2015-11-08 ENCOUNTER — Telehealth: Payer: Self-pay

## 2015-11-08 ENCOUNTER — Encounter: Payer: Self-pay | Admitting: Nurse Practitioner

## 2015-11-08 NOTE — Telephone Encounter (Signed)
Per Dr. Eden Emms, EF is better she can go back on lasix 20 mg if she wants check BMET and BNP then start and repeat labs in 4 weeks. Should f/u  With primary care if not feeling well Has had cath with normal cors  This year.  Will hold off on making any changes since Patient is scheduled to see [name] NP tomorrow. Will forward to [name] NP to advise at patient's appointment.

## 2015-11-09 ENCOUNTER — Ambulatory Visit: Payer: BLUE CROSS/BLUE SHIELD | Admitting: Nurse Practitioner

## 2015-11-09 NOTE — Progress Notes (Deleted)
CARDIOLOGY OFFICE NOTE  Date:  11/09/2015    Jenna Beltran Date of Birth: 1965/09/13 Medical Record #[id number]  PCP:  No PCP Per Patient  Cardiologist:  Tyrone SageGerhardt & Nishan/Klein    No chief complaint on file.   History of Present Illness: Jenna Beltran is a 50 y.o. female who presents today for a follow up visit. Seen for Dr. Eden EmmsNishan.   She has a history of gastric bypass surgery, obesity, LBBB, chronic systolic CHF (EF 40-98%25-30%), HTN, non compliance and pulmonary nodules.   She was last seen here back in March. She had had an echo back in 2016 with decreased EF - referred on for cardiac cath - see below. Last chest CT was from 05/2014 - she did not keep her rescheduled time for her CT.   In the ER back in September with chest heaviness, dyspnea and possible STD exposure. Had a negative CT of the abdomen/pelvis. BNP totally normal. Apparently has been referred to the CHF clinic - but unable to get ininitially.    I then saw her back - was volume overloaded. Was not taking her Lisinopril due to side effects. Got her medicines changed around. Referred to EP. Echo updated - EF has improved - no need for device implant. Still trying to get chest CT arranged.   Comes in today. Here with   Past Medical History:  Diagnosis Date  . H/O cardiac catheterization 04/2015   No coronary disease  . HTN (hypertension)   . Hypertension   . NICM (nonischemic cardiomyopathy) [facility](HCC)     Past Surgical History:  Procedure Laterality Date  . BREAST SURGERY    . CARDIAC CATHETERIZATION N/A 04/06/2015   Procedure: Right/Left Heart Cath and Coronary Angiography;  Surgeon: Wendall StadePeter C Nishan, MD;  [facility] INVASIVE CV LAB;  Service: Cardiovascular;  Laterality: N/A;  . GASTRIC BYPASS    . tummy tuck       Medications: Current Outpatient Prescriptions  Medication Sig Dispense Refill  . carvedilol (COREG) 6.25 MG tablet Take 1 tablet (6.25 mg total) by mouth 2 (two) times daily. 180 tablet 3    . furosemide (LASIX) 20 MG tablet Take 20 mg by mouth daily.    Marland Kitchen. lisinopril (PRINIVIL,ZESTRIL) 2.5 MG tablet Take 1 tablet (2.5 mg total) by mouth daily. 30 tablet 6  . spironolactone (ALDACTONE) 25 MG tablet Take 0.5 tablets (12.5 mg total) by mouth daily. 90 tablet 3   No current facility-administered medications for this visit.     Allergies: No Known Allergies  Social History: The patient  reports that she has never smoked. She has never used smokeless tobacco. She reports that she drinks alcohol. She reports that she does not use drugs.   Family History: The patient's family history is not on file.   Review of Systems: Please see the history of present illness.   Otherwise, the review of systems is positive for none.   All other systems are reviewed and negative.   Physical Exam: VS:  There were no vitals taken for this visit. Marland Kitchen.  BMI There is no height or weight on file to calculate BMI.  Wt Readings from Last 3 Encounters:  10/13/15 211 lb (95.7 kg)  09/28/15 208 lb (94.3 kg)  09/22/15 210 lb (95.3 kg)    General: Pleasant. Well developed, well nourished and in no acute distress.   HEENT: Normal.  Neck: Supple, no JVD, carotid bruits, or masses noted.  Cardiac: Regular rate and rhythm. No  murmurs, rubs, or gallops. No edema.  Respiratory:  Lungs are clear to auscultation bilaterally with normal work of breathing.  GI: Soft and nontender.  MS: No deformity or atrophy. Gait and ROM intact.  Skin: Warm and dry. Color is normal.  Neuro:  Strength and sensation are intact and no gross focal deficits noted.  Psych: Alert, appropriate and with normal affect.   LABORATORY DATA:  EKG:  EKG is not ordered today.  Lab Results  Component Value Date   WBC 5.0 09/18/2015   HGB 14.3 09/18/2015   HCT 43.5 09/18/2015   PLT 210 09/18/2015   GLUCOSE 106 (H) 09/28/2015   ALT 22 09/22/2015   AST 20 09/22/2015   NA 137 09/28/2015   K 4.2 09/28/2015   CL 102 09/28/2015    CREATININE 1.09 09/28/2015   BUN 27 (H) 09/28/2015   CO2 24 09/28/2015   TSH 3.99 09/22/2015   INR 0.95 04/02/2015    BNP (last 3 results)  Recent Labs  09/18/15 1030 09/22/15 0905 09/28/15 1016  BNP 84.6 38.3 25.4    ProBNP (last 3 results) No results for input(s): PROBNP in the last 8760 hours.   Other Studies Reviewed Today:  Echo Study Conclusions 10/2015  - Left ventricle: The cavity size was normal. Systolic function was   mildly reduced. The estimated ejection fraction was in the range   of 45% to 50%. There is akinesis of the basal-midinferior and   inferoseptal myocardium. Features are consistent with a   pseudonormal left ventricular filling pattern, with concomitant   abnormal relaxation and increased filling pressure (grade 2   diastolic dysfunction). Doppler parameters are consistent with   high ventricular filling pressure. - Aortic valve: Moderately calcified annulus. Trileaflet; mildly   thickened, mildly calcified leaflets. - Right ventricle: The cavity size was mildly dilated. Wall   thickness was normal. - Recommendations: Not all endocardial segments were well   visualized. Recommend limited echo with definity to evaluate   fuuther.  Recommendations:  Not all endocardial segments were well visualized. Recommend limited echo with definity to evaluate fuuther.    CT IMPRESSION FROM 05/2014: 1. Pulmonary artery enlargement suggests pulmonary arterial hypertension. The right hilar soft tissue fullness was likely related to an enlarged right pulmonary artery. No hilar adenopathy or mass. 2. Mild cardiomegaly. 3. Perifissural pulmonary nodules may represent subpleural lymph nodes. The largest measures 6 mm. If the patient is at high risk for bronchogenic carcinoma, follow-up chest CT at 6-12 months is recommended. If the patient is at low risk for bronchogenic carcinoma, follow-up chest CT at 12 months is recommended. This recommendation  follows the consensus statement: "Guidelines for Management of Small Pulmonary Nodules Detected on CT Scans: A Statement from the Fleischner Society" as published in Radiology2005;237:395-400. Online at: DietDisorder.cz.   Electronically Signed By: Jeronimo Greaves M.D. On: 05/22/2014 09:48    Assessment/Plan: 1. NICM - has EF of 20 to 25% that dates back over one year - trying to optimizing her medicines - will talk with EP to discuss possible device - has LBBB - and may benefit from CRT therapy. She was not able to tolerate the high dose of ACE - so she had not been taking - restarted at 2.5 mg a day last week - feels better. Also now on Aldactone. NSAID and potassium stopped last week. Recheck her lab - if BUN/creatinine ok - will increase her ACE by phone.   2. Chronic LV systolic HF - see #1  3.  Normal coronaries per cardiac cath from 04/2015  4. Non compliance - I don't really get this impression - I think she just had intolerance to high dose ACE.   5. HTN - added aldactone last week. Rechecking lab today and hope to increase her ACE further.   6. Obesity  7. Lung nodule - needs chest CT updated. Will arrange.  Current medicines are reviewed with the patient today.  The patient does not have concerns regarding medicines other than what has been noted above.  The following changes have been made:  See above.  Labs/ tests ordered today include:   No orders of the defined types were placed in this encounter.    Disposition:   FU with   Patient is agreeable to this plan and will call if any problems develop in the interim.   Signed: Rosalio Macadamia, RN, ANP-C 11/09/2015 9:12 AM  [facility] [address] [place], [place]  [address] Phone: [phone] Fax: [phone]

## 2015-11-10 NOTE — Telephone Encounter (Signed)
Called patient's number. Recording states patient cannot except calls at this time. Unable to leave message. Will try again later. Patient did not go to her office visit yesterday with [name] NP.

## 2015-11-15 ENCOUNTER — Inpatient Hospital Stay: Admission: RE | Admit: 2015-11-15 | Payer: BLUE CROSS/BLUE SHIELD | Source: Ambulatory Visit

## 2015-11-19 NOTE — Telephone Encounter (Signed)
Left message for patient to call back  

## 2015-11-23 ENCOUNTER — Encounter: Payer: Self-pay | Admitting: Nurse Practitioner

## 2015-11-30 NOTE — Telephone Encounter (Signed)
Left message for patient to call back. There has been several attempts to contact patient. Patient has not returned calls. Will send letter for patient to call office.

## 2016-03-11 ENCOUNTER — Emergency Department (HOSPITAL_COMMUNITY)
Admission: EM | Admit: 2016-03-11 | Discharge: 2016-03-11 | Discharge status: Absent without leave | Payer: BLUE CROSS/BLUE SHIELD

## 2016-03-11 NOTE — ED Triage Notes (Signed)
No answer x2 

## 2016-03-16 ENCOUNTER — Telehealth: Payer: Self-pay | Admitting: Cardiovascular Disease

## 2016-03-16 NOTE — Telephone Encounter (Signed)
New message   Pt is calling stating she is not feeling well. She states she wakes up, her hands are numb and tingling. She also says she has some sob and dizziness. Appt made with Dr. Eden Emms on 3/19 at 830.

## 2016-03-16 NOTE — Telephone Encounter (Signed)
Left message to call back  

## 2016-03-16 NOTE — Telephone Encounter (Signed)
Patient called back. Patient complaining of blurred vision, dizziness, numbness in hands in the morning, and fatigue. Patient stated she has been feeling this way over a week. Patient saw [facility] on Saturday. Patient stated the clinic told her she had a UTI and a bacterial infection, and that her symptoms were from her infection. The clinic gave patient Bactrim. Patient stated the Bactrim gave her a rash and hives. . Patient stated the the clinic told her to see her cardiologist right away. Informed patient that she needed to call the care clinic at [facility] and let them know that she had a reaction to the Bactrim, that they might give her something else for the infection. Patient has an appointment scheduled with Dr. Eden Emms on Monday. Encouraged patient go to ED if her symptoms get worse. Patient stated she was going to call the clinic at [facility], and will keep her appointment on Monday.

## 2016-03-17 NOTE — Progress Notes (Deleted)
CARDIOLOGY OFFICE NOTE  Date:  03/17/2016    Jenna Beltran Date of Birth: 1965-05-08 Medical Record #[id number]  PCP:  No PCP Per Patient  Cardiologist:  Tyrone Sage & Payne Garske  No chief complaint on file.   History of Present Illness: Jenna Beltran is a 51 y.o. female who presents today for f/u DCM fatigue and dyspnea   She has a history of gastric bypass surgery, obesity, LBBB, chronic systolic CHF (EF 40-98%), HTN, non compliance and pulmonary nodules.   She had had an echo back in 2016 with decreased EF - referred on for cardiac cath - see below.  Last chest CT was from 05/2014 - looks like repeat study not done.   Seen by Dr Marikay Alar and repeat echo showed improved EF personally reviewed EF 45-50%  Poor study recommended f/u with definity.  She continue to feel lousy and called office about getting back on lasix. Seen at [facility] for ? UTI  Rx with Bactrim -> rash and hives   Blurred Vision, dizziness, numbness in hands and fatigue   Past Medical History:  Diagnosis Date  . H/O cardiac catheterization 04/2015   No coronary disease  . HTN (hypertension)   . Hypertension   . NICM (nonischemic cardiomyopathy) [facility])     Past Surgical History:  Procedure Laterality Date  . BREAST SURGERY    . CARDIAC CATHETERIZATION N/A 04/06/2015   Procedure: Right/Left Heart Cath and Coronary Angiography;  Surgeon: Wendall Stade, MD;  Location: [facility] INVASIVE CV LAB;  Service: Cardiovascular;  Laterality: N/A;  . GASTRIC BYPASS    . tummy tuck       Medications: Current Outpatient Prescriptions  Medication Sig Dispense Refill  . carvedilol (COREG) 6.25 MG tablet Take 1 tablet (6.25 mg total) by mouth 2 (two) times daily. 180 tablet 3  . furosemide (LASIX) 20 MG tablet Take 20 mg by mouth daily.    Marland Kitchen lisinopril (PRINIVIL,ZESTRIL) 2.5 MG tablet Take 1 tablet (2.5 mg total) by mouth daily. 30 tablet 6  . spironolactone (ALDACTONE) 25 MG tablet Take 0.5 tablets (12.5 mg  total) by mouth daily. 90 tablet 3   No current facility-administered medications for this visit.     Allergies: No Known Allergies  Social History: The patient  reports that she has never smoked. She has never used smokeless tobacco. She reports that she drinks alcohol. She reports that she does not use drugs.   Family History: The patient's family history is not on file.   Review of Systems: Please see the history of present illness.   Otherwise, the review of systems is positive for none.   All other systems are reviewed and negative.   Physical Exam: VS:  There were no vitals taken for this visit. Marland Kitchen  BMI There is no height or weight on file to calculate BMI.  Wt Readings from Last 3 Encounters:  10/13/15 211 lb (95.7 kg)  09/28/15 208 lb (94.3 kg)  09/22/15 210 lb (95.3 kg)    General: Pleasant. She remains obese. Weight is down 2 pounds. She is alert and in no acute distress.   HEENT: Normal.  Neck: Supple, no JVD, carotid bruits, or masses noted.  Cardiac: Regular rate and rhythm. No S3 noted today.  Less edema.  Respiratory:  Lungs are clear to auscultation bilaterally with normal work of breathing.  GI: Soft and nontender.  MS: No deformity or atrophy. Gait and ROM intact.  Skin: Warm and dry. Color  is normal.  Neuro:  Strength and sensation are intact and no gross focal deficits noted.  Psych: Alert, appropriate and with normal affect.   LABORATORY DATA:  EKG:  EKG is not ordered today.  EKG from earlier this month with NSR with LBBB - QRS .15 noted.   Lab Results  Component Value Date   WBC 5.0 09/18/2015   HGB 14.3 09/18/2015   HCT 43.5 09/18/2015   PLT 210 09/18/2015   GLUCOSE 106 (H) 09/28/2015   ALT 22 09/22/2015   AST 20 09/22/2015   NA 137 09/28/2015   K 4.2 09/28/2015   CL 102 09/28/2015   CREATININE 1.09 09/28/2015   BUN 27 (H) 09/28/2015   CO2 24 09/28/2015   TSH 3.99 09/22/2015   INR 0.95 04/02/2015    BNP (last 3 results)  Recent  Labs  09/18/15 1030 09/22/15 0905 09/28/15 1016  BNP 84.6 38.3 25.4    ProBNP (last 3 results) No results for input(s): PROBNP in the last 8760 hours.   Other Studies Reviewed Today:  Right/Left Heart Cath and Coronary Angiography 04/2015  Conclusion   Normal right dominant coronary arteries Normal right heart pressures Non ischemic DCM EF 25-30% by echo   Echo Study Conclusions from 06/2014  - Left ventricle: The cavity size was moderately dilated. Wall thickness was increased in a pattern of mild LVH. Systolic function was severely reduced. The estimated ejection fraction was in the range of 25% to 30%. There is akinesis of the anteroseptal myocardium. Doppler parameters are consistent with abnormal left ventricular relaxation (grade 1 diastolic dysfunction). Doppler parameters are consistent with high ventricular filling pressure. - Ventricular septum: Septal motion showed abnormal function and dyssynergy. - Left atrium: The atrium was mildly dilated.  CT IMPRESSION FROM 05/2014: 1. Pulmonary artery enlargement suggests pulmonary arterial hypertension. The right hilar soft tissue fullness was likely related to an enlarged right pulmonary artery. No hilar adenopathy or mass. 2. Mild cardiomegaly. 3. Perifissural pulmonary nodules may represent subpleural lymph nodes. The largest measures 6 mm. If the patient is at high risk for bronchogenic carcinoma, follow-up chest CT at 6-12 months is recommended. If the patient is at low risk for bronchogenic carcinoma, follow-up chest CT at 12 months is recommended. This recommendation follows the consensus statement: "Guidelines for Management of Small Pulmonary Nodules Detected on CT Scans: A Statement from the Fleischner Society" as published in Radiology2005;237:395-400. Online at: DietDisorder.cz.   Electronically Signed   By: Jeronimo Greaves M.D.   On:  05/22/2014 09:48    Assessment/Plan: 1. NICM - has EF of 20 to 25% that dates back over one year - trying to optimizing her medicines - will talk with EP to discuss possible device - has LBBB - F/U echo 10/28/15 EF 45-50% and EP did not recommend CRT-D  2. Chronic LV systolic HF - see #1  3. Normal coronaries per cardiac cath from 04/2015  4. Non compliance - I don't really get this impression - I think she just had intolerance to high dose ACE.   5. HTN - added aldactone last week. Rechecking lab today and hope to increase her ACE further.   6. Obesity  7. Lung nodule - needs chest CT updated. Will arrange.    Charlton Haws

## 2016-03-20 ENCOUNTER — Ambulatory Visit: Payer: BLUE CROSS/BLUE SHIELD | Admitting: Cardiovascular Disease

## 2016-03-22 ENCOUNTER — Encounter: Payer: Self-pay | Admitting: Cardiovascular Disease

## 2016-05-12 ENCOUNTER — Other Ambulatory Visit: Payer: Self-pay | Admitting: Physician Assistant

## 2016-08-21 ENCOUNTER — Telehealth: Payer: Self-pay | Admitting: Nurse Practitioner

## 2016-08-21 ENCOUNTER — Other Ambulatory Visit: Payer: Self-pay | Admitting: *Deleted

## 2016-08-21 MED ORDER — LISINOPRIL 2.5 MG PO TABS: 2.5000 mg | ORAL_TABLET | Freq: Every day | ORAL | 0 refills | Status: AC

## 2016-08-21 NOTE — Telephone Encounter (Addendum)
New messge       *STAT* If patient is at the pharmacy, call can be transferred to refill team.   1. Which medications need to be refilled? (please list name of each medication and dose if known)  lisinopril 2.5mg   2. Which pharmacy/location (including street and city if local pharmacy) is medication to be sent to? [facility] @ [address]-----new pharmacy 3. Do they need a 30 day or 90 day supply? 30 day

## 2016-08-21 NOTE — Telephone Encounter (Signed)
Jenna Beltran stated pt can get a 30 day supply, no refill and get medication from PCP. Was sent in today to's pt requested pharmacy.

## 2016-08-21 NOTE — Telephone Encounter (Signed)
Please advise on refill request as Lawson Fiscal has been refilling this for the patient but when she last saw her on 09/28/15 Lawson Fiscal told her to follow up prn. Patient was supposed to see Dr Gala Romney one week from that appointment, but she no-showed. She has also since no-showed with Lawson Fiscal and Dr Eden Emms.  Thanks, MI

## 2017-04-14 IMAGING — CT CT ABD-PELV W/ CM
2 of 5 series · 16 of 46 positions shown, 18 images · IV contrast (Omni 300)
Comparison: None.

CLINICAL DATA: Right lower quadrant pain and tenderness as well as
swelling for 2 weeks. This has worsened over the last 2 days.

EXAM:
CT ABDOMEN AND PELVIS WITH CONTRAST
TECHNIQUE: Multidetector CT imaging of the abdomen and pelvis was performed
using the standard protocol following bolus administration of
intravenous contrast.
CONTRAST:  100mL LHIS7L-HRR IOPAMIDOL (LHIS7L-HRR) INJECTION 61%

[Series 2: a/p w/ 5mm · axial · 0.98mm/px · z∈[+902,+1312]mm · 13 of 92 slices shown, 15 images]
[im 5/92  soft-tissue]
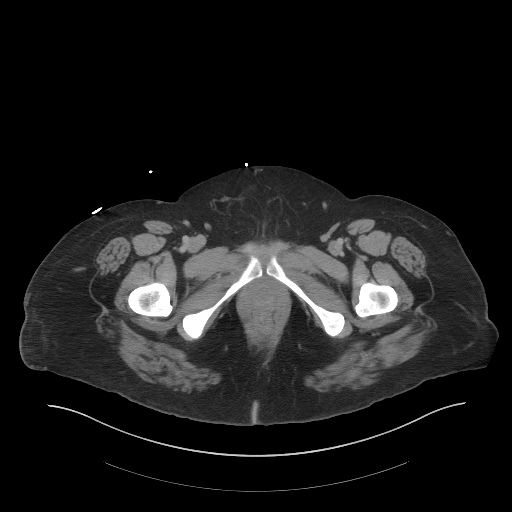
[im 5/92  bone]
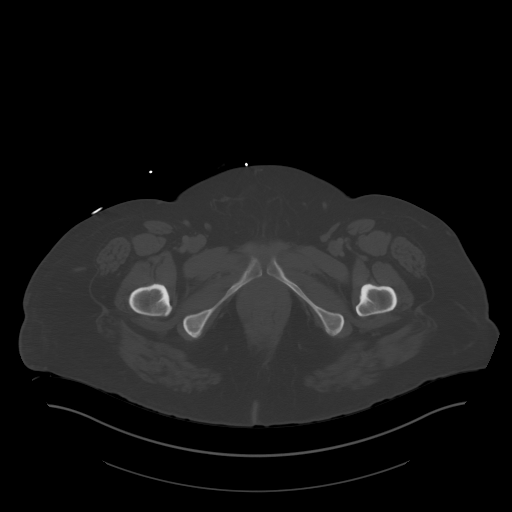
[im 15/92  soft-tissue]
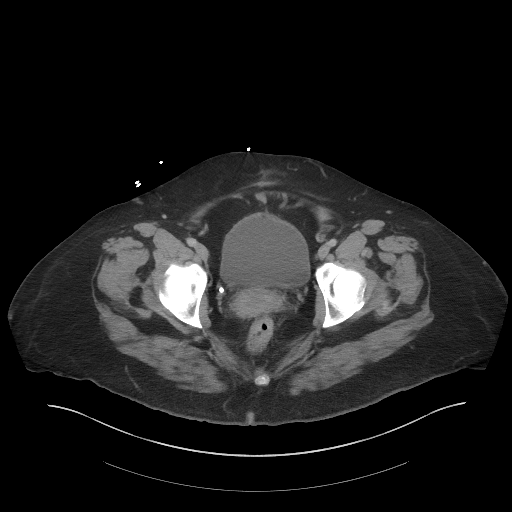
[im 20/92  soft-tissue]
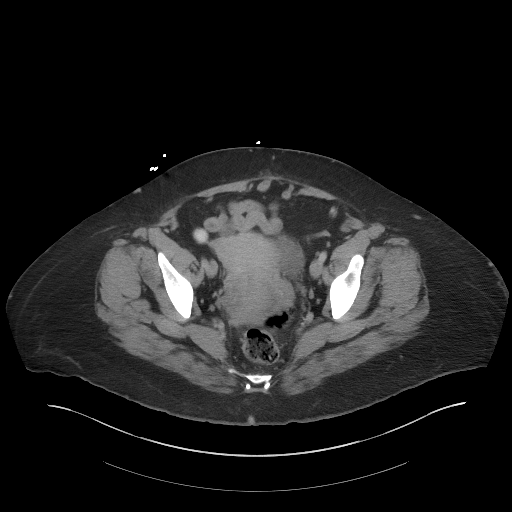
[im 24/92  soft-tissue]
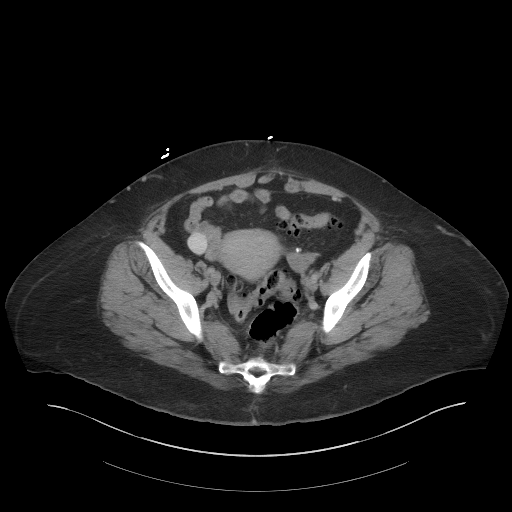
[im 34/92  soft-tissue]
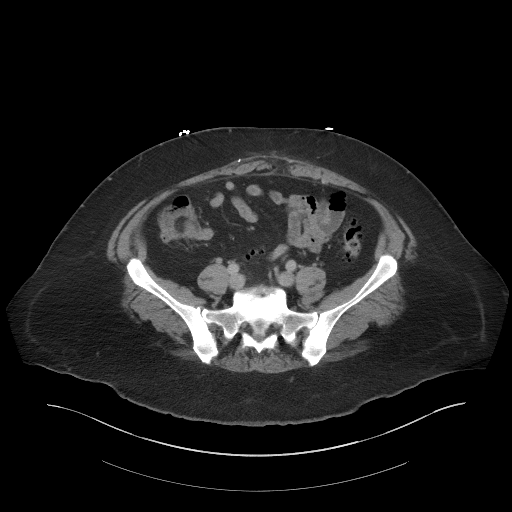
[im 39/92  soft-tissue]
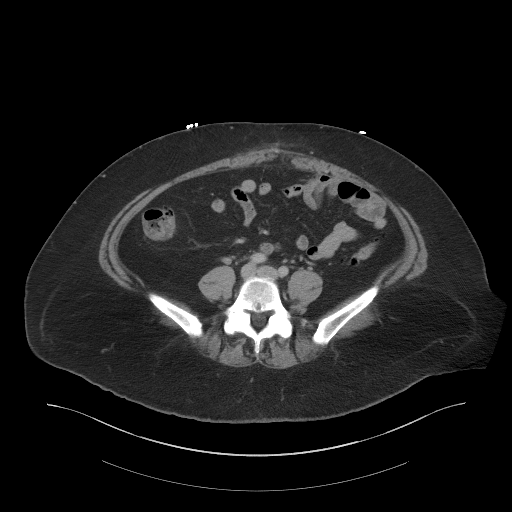
[im 48/92  soft-tissue]
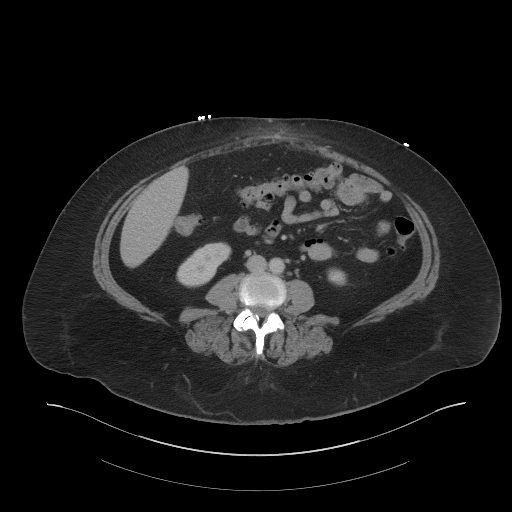
[im 53/92  soft-tissue]
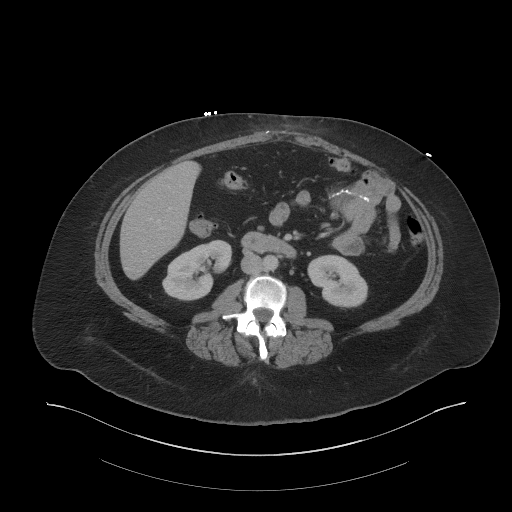
[im 58/92  soft-tissue]
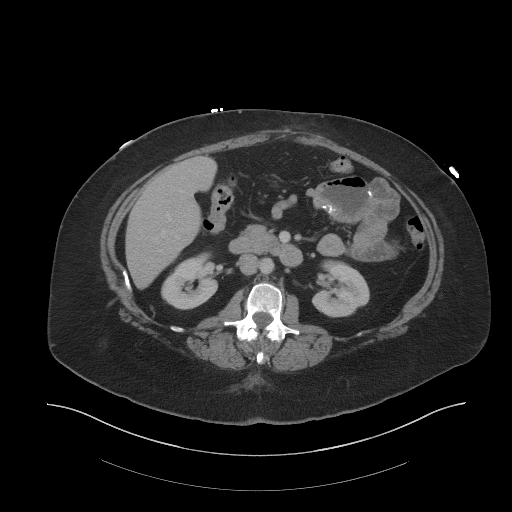
[im 58/92  bone]
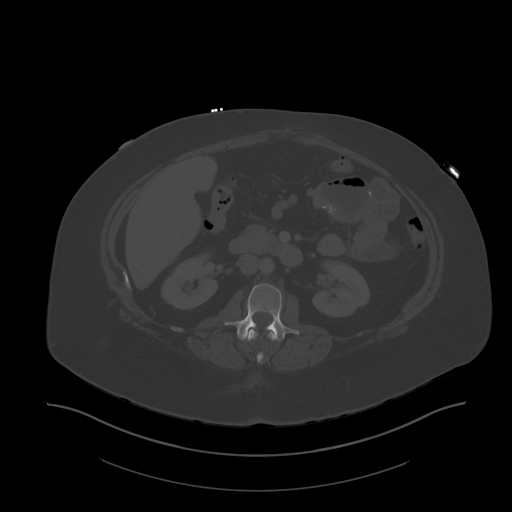
[im 68/92  soft-tissue]
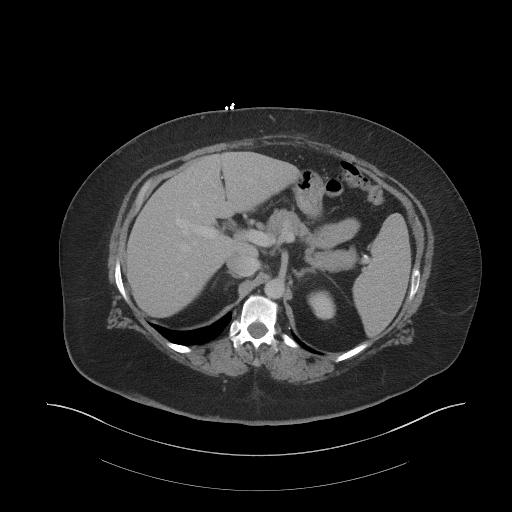
[im 72/92  soft-tissue]
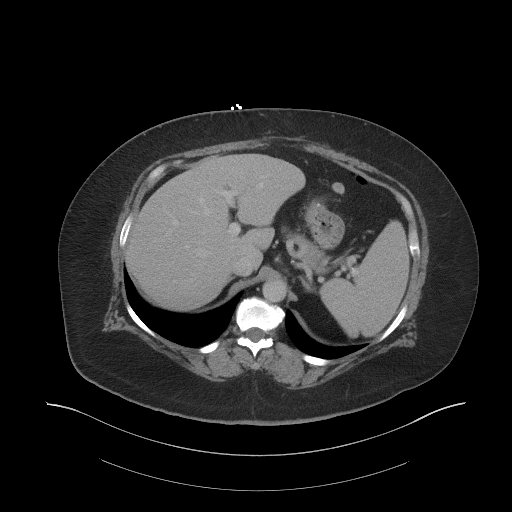
[im 77/92  soft-tissue]
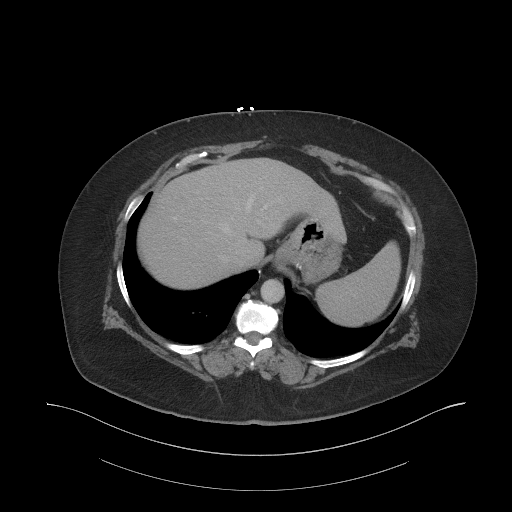
[im 87/92  soft-tissue]
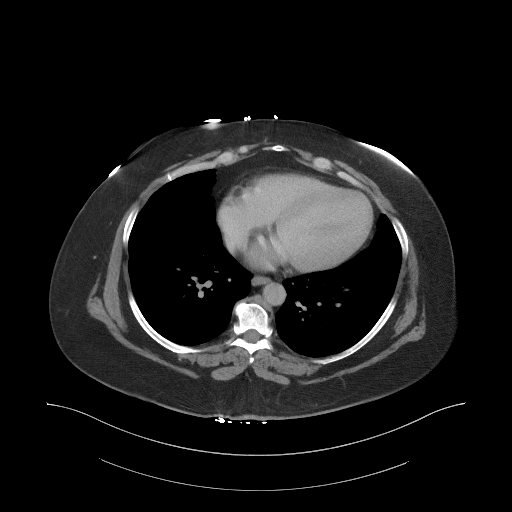

[Series 5: a/p w/ cor · coronal · 0.66mm/px · 3 of 151 slices shown]
[im 51/151  soft-tissue]
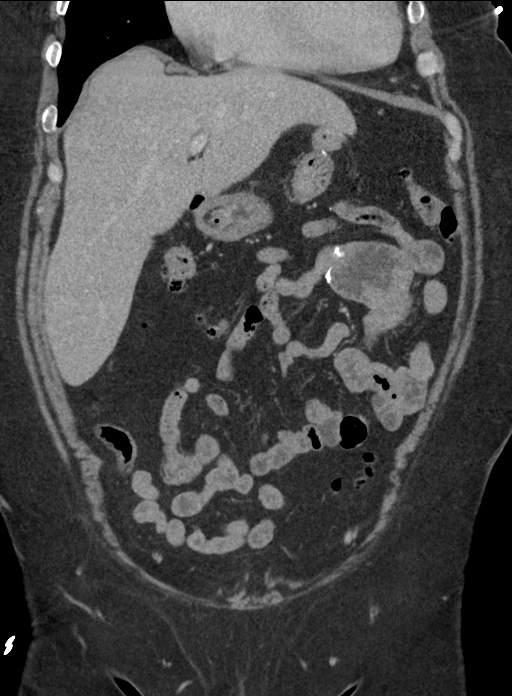
[im 67/151  soft-tissue]
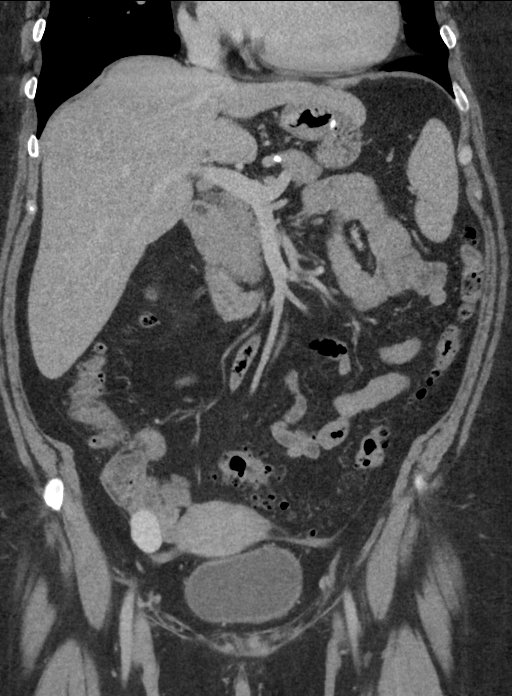
[im 84/151  soft-tissue]
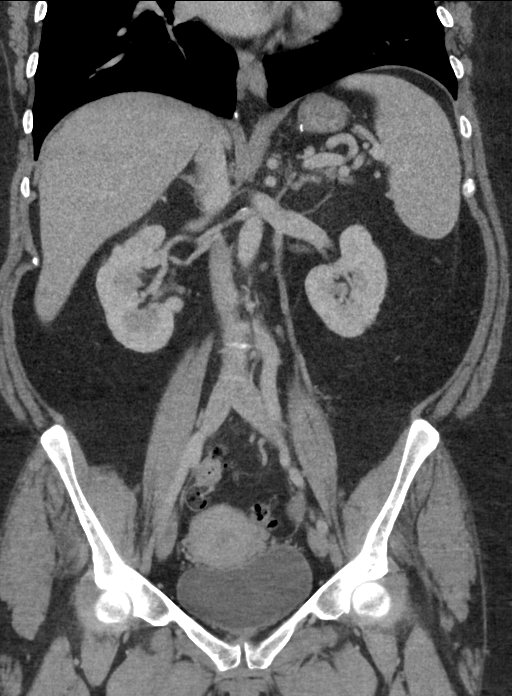

[16 of 46 positions shown; findings below may reference images not displayed]

FINDINGS: Lower chest: Lung bases clear.  Mild cardiomegaly.

Hepatobiliary: Normal liver. Gallbladder surgically absent. No bile
duct dilation.

Pancreas: Unremarkable. No pancreatic ductal dilatation or
surrounding inflammatory changes.

Spleen: Normal in size without focal abnormality.

Adrenals/Urinary Tract: Adrenal glands are unremarkable. Kidneys are
normal, without renal calculi, focal lesion, or hydronephrosis.
Bladder is unremarkable.

Stomach/Bowel: Status post gastric stapling with formation of a
gastrojejunostomy. No stomach wall thickening or inflammation. Small
bowel anastomosis on the left mid abdomen with associated focal
small bowel dilation. Small bowel otherwise unremarkable. There are
numerous colonic diverticula. No diverticulitis. No other colonic
abnormality. Normal appendix visualized

Vascular/Lymphatic: No significant vascular findings are present. No
enlarged abdominal or pelvic lymph nodes.

Reproductive: Uterus and bilateral adnexa are unremarkable.

Other: Anterior abdominal wall surgical scar and apparent hernia
mesh. No hernia visualized. No ascites.

Musculoskeletal: Degenerative changes most evident at L5-S1. No
osteoblastic or osteolytic lesions.
IMPRESSION: 1. No acute findings in the abdomen or pelvis. No findings to
account for this patient's symptoms.
2. Normal appendix.
3. Numerous colonic diverticula.  No diverticulitis.
4. Status post cholecystectomy. Status post gastric stapling
procedure with formation of a gastrojejunostomy. No bowel
obstruction or inflammation.
